# Patient Record
Sex: Female | Born: 1980 | Race: White | Hispanic: No | Marital: Single | Smoking: Never smoker
Health system: Southern US, Community
[De-identification: ages and names within clinical notes are randomized; demographics above are authoritative.]

## PROBLEM LIST (undated history)

## (undated) DIAGNOSIS — I10 Essential (primary) hypertension: Secondary | ICD-10-CM

## (undated) DIAGNOSIS — Q059 Spina bifida, unspecified: Secondary | ICD-10-CM

## (undated) DIAGNOSIS — N920 Excessive and frequent menstruation with regular cycle: Secondary | ICD-10-CM

## (undated) DIAGNOSIS — Z1371 Encounter for nonprocreative screening for genetic disease carrier status: Secondary | ICD-10-CM

## (undated) DIAGNOSIS — C4492 Squamous cell carcinoma of skin, unspecified: Secondary | ICD-10-CM

## (undated) DIAGNOSIS — E282 Polycystic ovarian syndrome: Secondary | ICD-10-CM

## (undated) DIAGNOSIS — Z8041 Family history of malignant neoplasm of ovary: Secondary | ICD-10-CM

## (undated) HISTORY — DX: Essential (primary) hypertension: I10

## (undated) HISTORY — DX: Excessive and frequent menstruation with regular cycle: N92.0

## (undated) HISTORY — PX: TUBAL LIGATION: SHX77

## (undated) HISTORY — DX: Encounter for nonprocreative screening for genetic disease carrier status: Z13.71

## (undated) HISTORY — DX: Spina bifida, unspecified: Q05.9

## (undated) HISTORY — DX: Family history of malignant neoplasm of ovary: Z80.41

## (undated) HISTORY — DX: Squamous cell carcinoma of skin, unspecified: C44.92

## (undated) HISTORY — DX: Polycystic ovarian syndrome: E28.2

---
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
## 2008-01-23 ENCOUNTER — Encounter: Payer: Self-pay | Admitting: Maternal & Fetal Medicine

## 2008-02-06 ENCOUNTER — Observation Stay: Payer: Self-pay

## 2008-02-07 ENCOUNTER — Inpatient Hospital Stay: Payer: Self-pay

## 2008-07-05 DIAGNOSIS — D239 Other benign neoplasm of skin, unspecified: Secondary | ICD-10-CM

## 2008-07-05 HISTORY — DX: Other benign neoplasm of skin, unspecified: D23.9

## 2011-09-27 ENCOUNTER — Observation Stay: Payer: Self-pay | Admitting: Obstetrics and Gynecology

## 2011-10-12 ENCOUNTER — Ambulatory Visit: Payer: Self-pay

## 2011-10-13 ENCOUNTER — Inpatient Hospital Stay: Payer: Self-pay

## 2012-12-01 DIAGNOSIS — L680 Hirsutism: Secondary | ICD-10-CM | POA: Insufficient documentation

## 2013-01-19 ENCOUNTER — Ambulatory Visit: Payer: Self-pay

## 2013-01-19 LAB — PREGNANCY, URINE: Pregnancy Test, Urine: NEGATIVE m[IU]/mL

## 2013-01-20 ENCOUNTER — Ambulatory Visit: Payer: Self-pay

## 2013-01-20 HISTORY — PX: HYSTEROSCOPY WITH D & C: SHX1775

## 2015-03-08 NOTE — Op Note (Signed)
PATIENT NAME:  Claire Olsen, DELAVEGA MR#:  [id number] DATE OF BIRTH:  03/10/1981  DATE OF PROCEDURE:  01/20/2013  PREOPERATIVE DIAGNOSIS:  Severe menorrhagia.   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   OPERATION PERFORMED:  D and C, attempted hysteroscopy, Mirena IUD insertion.   OPERATIVE FINDINGS:  Uterus sounded to 10 cm. I was unable to visualize uterine cavity secondary to much bleeding.   DESCRIPTION OF PROCEDURE:  After adequate general anesthesia, the patient was prepped and draped in routine fashion. 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 Cervix was grasped with Ardis Hughs tenaculum. The uterine cavity sounded to 10 cm. The cervix was dilated with ease.  Hysteroscopy instrument was inserted and attempted visualization was unsuccessful secondary to much blood. Uterine cavity was then systematically curetted with return of a large amount of tissue. Exploration using polyp forceps revealed no further tissue. The Mirena IUD was inserted without incident. The patient tolerated the procedure well and left the operating room in good condition. Sponge and needle counts were said to be correct at the end of the procedure.     ____________________________ Wonda Cheng. Laurey Morale, MD pjr:dmm D: 01/20/2013 12:57:02 ET T: 01/20/2013 13:35:16 ET JOB#: [id number]  cc: Wonda Cheng. Laurey Morale, MD, <Dictator> Rosina Lowenstein MD ELECTRONICALLY SIGNED 01/23/2013 20:48

## 2016-03-09 ENCOUNTER — Other Ambulatory Visit: Payer: Self-pay | Admitting: Advanced Practice Midwife

## 2016-03-09 DIAGNOSIS — N644 Mastodynia: Secondary | ICD-10-CM

## 2016-03-19 ENCOUNTER — Ambulatory Visit
Admission: RE | Admit: 2016-03-19 | Discharge: 2016-03-19 | Disposition: A | Discharge status: Home / Self Care | Payer: BC Managed Care – PPO | Source: Ambulatory Visit | Attending: Advanced Practice Midwife | Admitting: Advanced Practice Midwife

## 2016-03-19 DIAGNOSIS — N644 Mastodynia: Secondary | ICD-10-CM

## 2016-04-16 DIAGNOSIS — Z1371 Encounter for nonprocreative screening for genetic disease carrier status: Secondary | ICD-10-CM

## 2016-04-16 HISTORY — DX: Encounter for nonprocreative screening for genetic disease carrier status: Z13.71

## 2016-11-03 ENCOUNTER — Other Ambulatory Visit: Payer: Self-pay | Admitting: Advanced Practice Midwife

## 2016-11-03 DIAGNOSIS — N644 Mastodynia: Secondary | ICD-10-CM

## 2016-11-26 ENCOUNTER — Ambulatory Visit
Admission: RE | Admit: 2016-11-26 | Discharge: 2016-11-26 | Disposition: A | Discharge status: Home / Self Care | Payer: BC Managed Care – PPO | Source: Ambulatory Visit | Attending: Advanced Practice Midwife | Admitting: Advanced Practice Midwife

## 2016-11-26 DIAGNOSIS — N644 Mastodynia: Secondary | ICD-10-CM | POA: Diagnosis not present

## 2017-03-10 DIAGNOSIS — E282 Polycystic ovarian syndrome: Secondary | ICD-10-CM | POA: Insufficient documentation

## 2017-03-25 ENCOUNTER — Encounter: Payer: Self-pay | Admitting: Obstetrics and Gynecology

## 2017-03-30 ENCOUNTER — Ambulatory Visit: Payer: Self-pay | Admitting: Obstetrics and Gynecology

## 2017-05-05 ENCOUNTER — Ambulatory Visit: Payer: Self-pay | Admitting: Obstetrics and Gynecology

## 2017-07-28 ENCOUNTER — Ambulatory Visit (INDEPENDENT_AMBULATORY_CARE_PROVIDER_SITE_OTHER): Payer: BC Managed Care – PPO | Admitting: Obstetrics and Gynecology

## 2017-07-28 ENCOUNTER — Encounter: Payer: Self-pay | Admitting: Obstetrics and Gynecology

## 2017-07-28 VITALS — BP 120/80 | HR 84 | Ht 70.0 in | Wt 227.0 lb

## 2017-07-28 DIAGNOSIS — Z8041 Family history of malignant neoplasm of ovary: Secondary | ICD-10-CM

## 2017-07-28 DIAGNOSIS — Z113 Encounter for screening for infections with a predominantly sexual mode of transmission: Secondary | ICD-10-CM

## 2017-07-28 DIAGNOSIS — Z1231 Encounter for screening mammogram for malignant neoplasm of breast: Secondary | ICD-10-CM | POA: Diagnosis not present

## 2017-07-28 DIAGNOSIS — N920 Excessive and frequent menstruation with regular cycle: Secondary | ICD-10-CM

## 2017-07-28 DIAGNOSIS — Z30431 Encounter for routine checking of intrauterine contraceptive device: Secondary | ICD-10-CM | POA: Diagnosis not present

## 2017-07-28 DIAGNOSIS — Z124 Encounter for screening for malignant neoplasm of cervix: Secondary | ICD-10-CM

## 2017-07-28 DIAGNOSIS — Z01419 Encounter for gynecological examination (general) (routine) without abnormal findings: Secondary | ICD-10-CM

## 2017-07-28 DIAGNOSIS — R928 Other abnormal and inconclusive findings on diagnostic imaging of breast: Secondary | ICD-10-CM

## 2017-07-28 DIAGNOSIS — Z1239 Encounter for other screening for malignant neoplasm of breast: Secondary | ICD-10-CM

## 2017-07-28 NOTE — Progress Notes (Signed)
PCP:  Patient, No Pcp Per   Chief Complaint  Patient presents with  . Gynecologic Exam    Cycle has returned in past 6 months & is extended & heavy. IUD expire 01/2018     HPI:      [name] is a 36 y.o. G2P0010 who LMP was Patient's last menstrual period was 07/06/2017., presents today for her annual examination.  Her menses are regular every 28-30 days, lasting 9-12 days. Flow is heavy, changing super plus Q2 hrs, with clots.  Dysmenorrhea mild, occurring first 1-2 days of flow. She does not have intermenstrual bleeding. She had Mirena placed 01/25/13 due to menorrhagia after D&C. She had amenorrhea until the past 5-6 months and periods are getting heavier and longer each month. She and Dr. Laurey Morale discussed endometrial ablation if IUD didn't work. Pt would like another IUD.  Sex activity: single partner, contraception - tubal ligation.  Last Pap: June 17, 2015  Results were: no abnormalities /neg HPV DNA  Hx of STDs: none  Last mammogram: 1/18 at [facility].  Results were: cat 3 for bilat breast asymmetries. Pt due for 6 mo f/u mammo and u/s.  There is no FH of breast cancer. There is a FH of ovarian cancer in her mother, mat aunt and PGM. Pt is MyRisk neg 2017. The patient does do self-breast exams.  Tobacco use: The patient denies current or previous tobacco use. Alcohol use: none No drug use.  Exercise: moderately active  She does get adequate calcium and Vitamin D in her diet.    Past Medical History:  Diagnosis Date  . BRCA negative 04/2016   MyRisk neg  . Family history of ovarian cancer    mother and mat aunt  . Menorrhagia   . PCOS (polycystic ovarian syndrome)   . 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 CESAREAN SECTION  2009/2012  . HYSTEROSCOPY W/D&C  01/20/2013   Severe menorrhagia   ([facility])  . TUBAL LIGATION      Family History  Problem Relation Age of Onset  . Cervical cancer Mother    . Ovarian cancer Mother 33  . Hyperlipidemia Mother   . Ovarian cancer Maternal Aunt        late 20's  . Hypertension Father   . Hyperlipidemia Maternal Grandfather   . Hypertension Paternal Grandmother   . Melanoma Paternal Grandfather 12  . Prostate cancer Paternal Grandfather   . Breast cancer Neg Hx     Social History   Social History  . Marital status: Single    Spouse name: N/A  . Number of children: N/A  . Years of education: N/A   Occupational History  . Not on file.   Social History Main Topics  . Smoking status: Never Smoker  . Smokeless tobacco: Never Used  . Alcohol use No  . Drug use: No  . Sexual activity: Yes    Partners: Male    Birth control/ protection: IUD   Other Topics Concern  . Not on file   Social History Narrative  . No narrative on file    Current Meds  Medication Sig  . levonorgestrel (MIRENA) 20 MCG/24HR IUD 1 each by Intrauterine route once.     ROS:  Review of Systems  Constitutional: Negative for fatigue, fever and unexpected weight change.  Respiratory: Negative for cough, shortness of breath and wheezing.   Cardiovascular: Negative for chest pain, palpitations  and leg swelling.  Gastrointestinal: Negative for blood in stool, constipation, diarrhea, nausea and vomiting.  Endocrine: Negative for cold intolerance, heat intolerance and polyuria.  Genitourinary: Negative for dyspareunia, dysuria, flank pain, frequency, genital sores, hematuria, menstrual problem, pelvic pain, urgency, vaginal bleeding, vaginal discharge and vaginal pain.  Musculoskeletal: Negative for back pain, joint swelling and myalgias.  Skin: Negative for rash.  Neurological: Negative for dizziness, syncope, light-headedness, numbness and headaches.  Hematological: Negative for adenopathy.  Psychiatric/Behavioral: Negative for agitation, confusion, sleep disturbance and suicidal ideas. The patient is not nervous/anxious.      Objective: BP 120/80 (BP  Location: Left Arm, Patient Position: Sitting, Cuff Size: Normal)   Pulse 84   Ht 5' 10"  (1.778 m)   Wt 227 lb (103 kg)   LMP 07/06/2017   BMI 32.57 kg/m    Physical Exam  Constitutional: She is oriented to person, place, and time. She appears well-developed and well-nourished.  Genitourinary: Vagina normal and uterus normal. There is no rash or tenderness on the right labia. There is no rash or tenderness on the left labia. No erythema or tenderness in the vagina. No vaginal discharge found. Right adnexum does not display mass and does not display tenderness. Left adnexum does not display mass and does not display tenderness. Cervix does not exhibit motion tenderness or polyp. Uterus is not enlarged or tender.  Neck: Normal range of motion. No thyromegaly present.  Cardiovascular: Normal rate, regular rhythm and normal heart sounds.   No murmur heard. Pulmonary/Chest: Effort normal and breath sounds normal. Right breast exhibits no mass, no nipple discharge, no skin change and no tenderness. Left breast exhibits no mass, no nipple discharge, no skin change and no tenderness.  Abdominal: Soft. There is no tenderness. There is no guarding.  Musculoskeletal: Normal range of motion.  Neurological: She is alert and oriented to person, place, and time. No cranial nerve deficit.  Psychiatric: She has a normal mood and affect. Her behavior is normal.  Vitals reviewed.   Assessment/Plan: Encounter for annual routine gynecological examination  Cervical cancer screening - Plan: IGP, Aptima HPV  Screening for STD (sexually transmitted disease) - Plan: IGP, Aptima HPV  Abnormal mammogram - Pt to sched mammo/u/s f/u.  - Plan: MM DIAG BREAST TOMO BILATERAL, US BREAST LTD UNI LEFT INC AXILLA, US BREAST LTD UNI RIGHT INC AXILLA  Screening for breast cancer  Family history of ovarian cancer - Pt is MyRisk neg. No further screening at this time.   Menorrhagia with regular cycle - Worsening sx over  the past 6 months. Pt will RTO with next menses to remove old IUD and insert new Mirena. Discussed hormonal drop at year 4 of Mirena.  Encounter for routine checking of intrauterine contraceptive device (IUD) - Placed 01/25/13           GYN counsel breast self exam, mammography screening, adequate intake of calcium and vitamin D, diet and exercise     F/U  Return in about 1 year (around 07/28/2018).  Makenzey Nanni B. Jenasia Dolinar, PA-C 07/28/2017 2:55 PM

## 2017-07-30 LAB — IGP, APTIMA HPV
HPV APTIMA: NEGATIVE
PAP SMEAR COMMENT: 0

## 2017-08-17 ENCOUNTER — Ambulatory Visit
Admission: RE | Admit: 2017-08-17 | Discharge: 2017-08-17 | Disposition: A | Discharge status: Home / Self Care | Payer: BC Managed Care – PPO | Source: Ambulatory Visit | Attending: Obstetrics and Gynecology | Admitting: Obstetrics and Gynecology

## 2017-08-17 DIAGNOSIS — R928 Other abnormal and inconclusive findings on diagnostic imaging of breast: Secondary | ICD-10-CM

## 2017-09-14 ENCOUNTER — Encounter: Payer: Self-pay | Admitting: Advanced Practice Midwife

## 2017-09-14 ENCOUNTER — Ambulatory Visit (INDEPENDENT_AMBULATORY_CARE_PROVIDER_SITE_OTHER): Payer: BC Managed Care – PPO | Admitting: Advanced Practice Midwife

## 2017-09-14 VITALS — BP 130/80 | HR 74 | Ht 70.0 in | Wt 233.0 lb

## 2017-09-14 DIAGNOSIS — E669 Obesity, unspecified: Secondary | ICD-10-CM | POA: Insufficient documentation

## 2017-09-14 DIAGNOSIS — Z30433 Encounter for removal and reinsertion of intrauterine contraceptive device: Secondary | ICD-10-CM

## 2017-09-14 NOTE — Progress Notes (Signed)
    GYNECOLOGY OFFICE PROCEDURE NOTE  Claire Olsen is a 36 y.o. G2P0010 here for IUD removal and reinsertion. The patient currently has a Mirena IUD placed on March 2013, which will be replaced with a Mirena IUD today.  No GYN concerns.  Last pap smear was September 2018 and was normal. She has had a bilateral tubal ligation and has been using the IUD primarily for heavy cycle control. Her bleeding has increased some since the IUD is 6 months past expiration.   IUD Removal and Reinsertion  Patient identified, informed consent performed, consent signed.   Discussed risks of irregular bleeding, cramping, infection, malpositioning or uterine perforation of the IUD which may require further procedures. Time out was performed. Speculum placed in the vagina. The strings of the IUD were grasped and pulled using ring forceps. The IUD was successfully removed in its entirety. The cervix was cleaned with Betadine x 2. The uterus was sounded to IUD insertion apparatus was used to sound the uterus to 8 cm using a uterine sound.  The IUD was then placed per manufacturer's recommendations. Strings trimmed to 3 cm. Tenaculum was removed, good hemostasis noted. Patient tolerated procedure well.   Patient was given post-procedure instructions.  Patient was also asked to check IUD strings periodically and follow up as needed for IUD check.   Rod Can, CNM  [facility], [facility]  IUD insertion CPT [id number],  Isla Pence [id number] Mirena [phone] [place]ie [place] [phone] Paraguard [id number] IUD remval 26712 [id number] 33, plus Modifer 79 is done during a global billing visit

## 2017-09-15 ENCOUNTER — Ambulatory Visit: Payer: BC Managed Care – PPO | Admitting: Maternal Newborn

## 2018-03-08 IMAGING — US US BREAST*R* LIMITED INC AXILLA
1 series · 4 of 4 positions shown · non-contrast
Comparison: None.

CLINICAL DATA: 35-year-old female with intermittent left breast
pain at 3 o'clock for 6 months. This is the patient's baseline exam.
The patient reports a family history of ovarian and cervical cancer
in her mother diagnosed at the age of 34 and ovarian cancer
diagnosed in a maternal aunt in her 30s. The patient states that
genetic testing has been recommended to her.

EXAM:
2D DIGITAL DIAGNOSTIC BILATERAL MAMMOGRAM WITH CAD AND ADJUNCT TOMO
ULTRASOUND BILATERAL BREAST

[Series 1: us breast*right* limited inc axilla · 0.06mm/px · 4 of 4 slices shown]
[im 1/4]
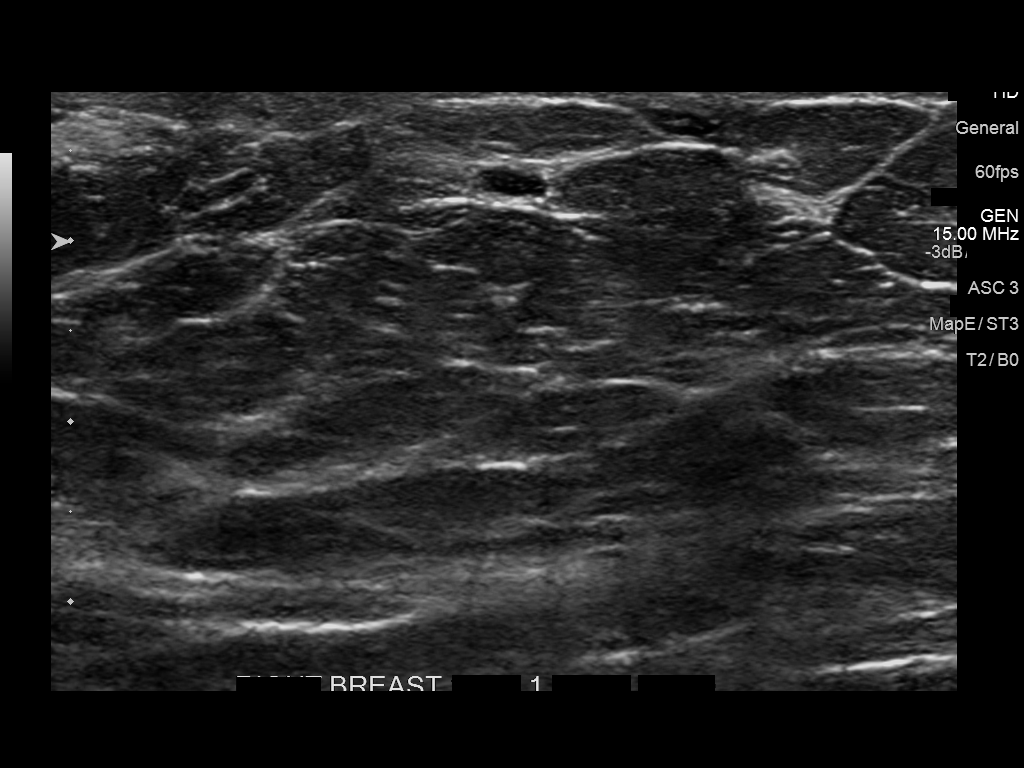
[im 2/4]
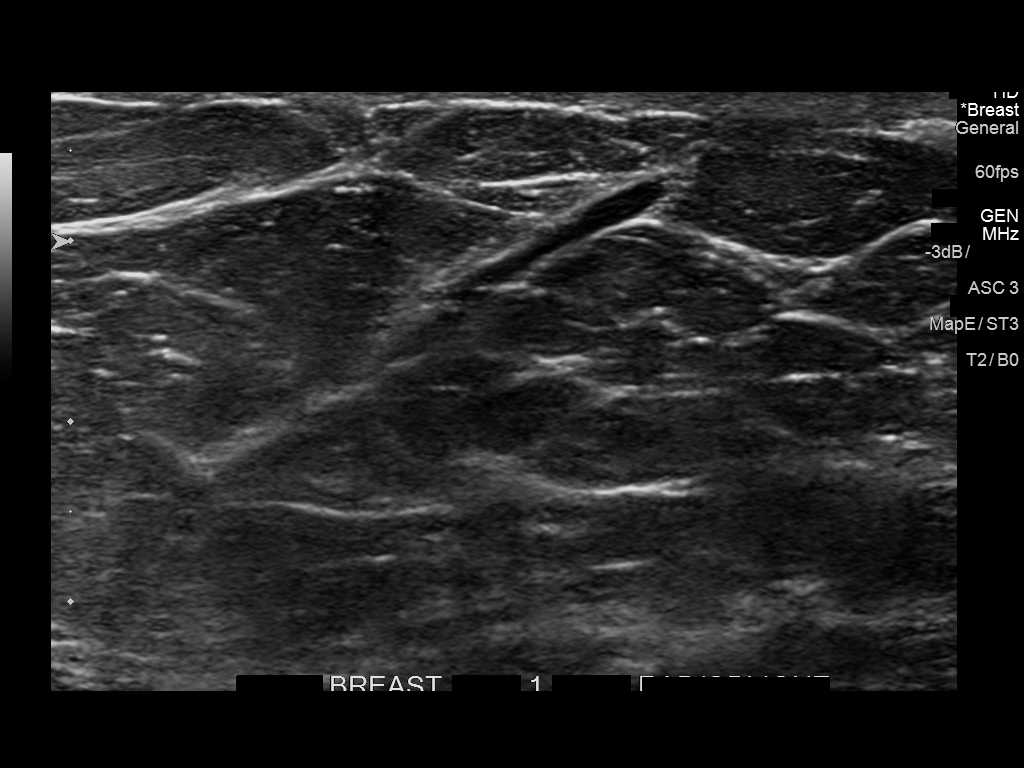
[im 3/4]
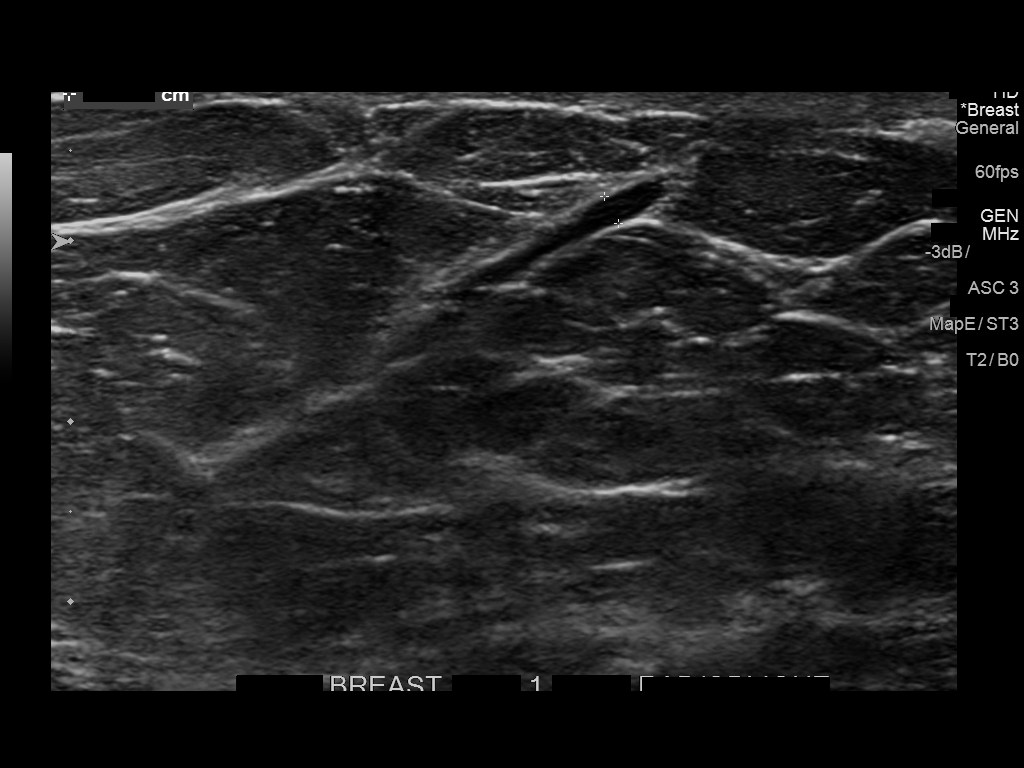
[im 4/4]
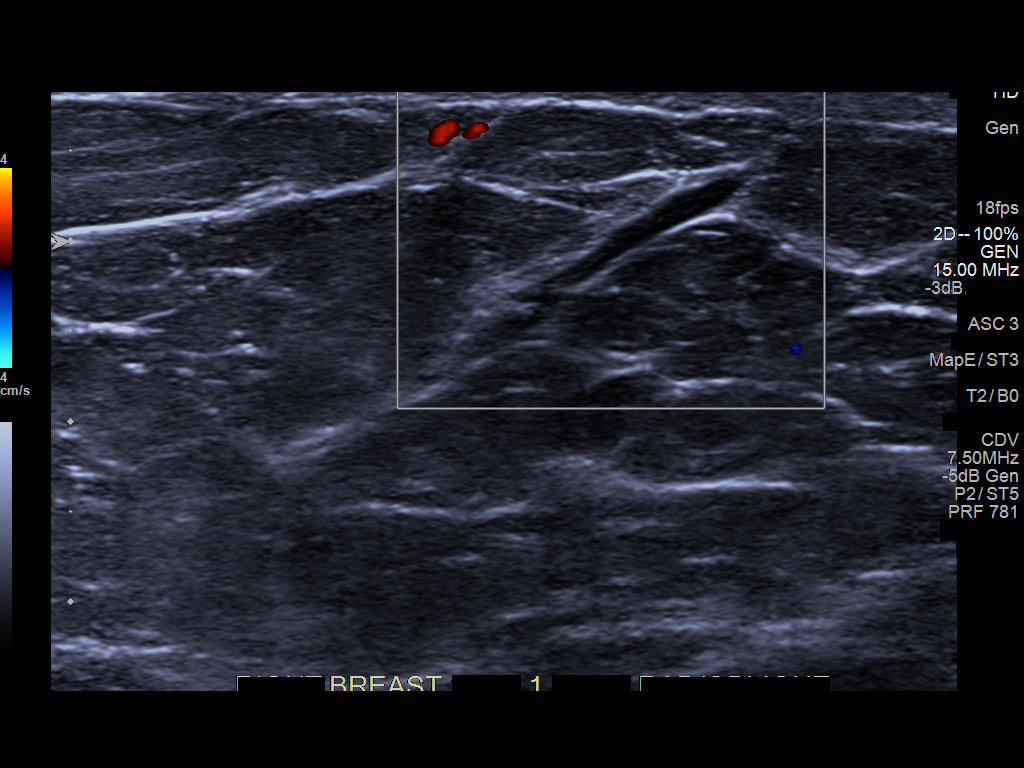

[4 of 4 positions shown; findings below may reference images not displayed]

ACR Breast Density Category b: There are scattered areas of
fibroglandular density.
FINDINGS: An asymmetry was identified within the lateral left breast which
appeared less conspicuous on spot compression view of this region
and may represent an island of fibroglandular tissue. Within the
anterior right breast, an asymmetry was identified just superior to
the nipple. No suspicious calcifications or distortion was
identified within either breast.

Mammographic images were processed with CAD.

On physical exam, no discrete mass is felt in the area of concern
within the lateral left breast or in the superior right breast.

Targeted ultrasound of the lateral left breast was performed
demonstrating no suspicious cystic or solid sonographic finding. No
definite correlate was identified for the asymmetry seen
mammographically. Targeted ultrasound of the right breast
demonstrates no suspicious cystic or solid sonographic finding in
the area of concern. Minimal duct ectasia is incidentally noted.
IMPRESSION: Probably benign bilateral asymmetries.

RECOMMENDATION:
1. Bilateral diagnostic mammogram and possible ultrasound in 6
months.
2. The patient was instructed to follow-up with her referring
clinician regarding her breast pain and recommendation for genetic
testing.

I have discussed the findings and recommendations with the patient.
Results were also provided in writing at the conclusion of the
visit. If applicable, a reminder letter will be sent to the patient
regarding the next appointment.

BI-RADS CATEGORY  3: Probably benign.

## 2019-08-06 IMAGING — US US BREAST*L* LIMITED INC AXILLA
1 series · 6 of 6 positions shown · non-contrast
Comparison: 11/26/2016, 03/19/2016.

CLINICAL DATA: One year interval follow-up of likely benign focal
asymmetries in both breast, without sonographic correlate on
previous ultrasounds. The patient presented in March 2016 with a
palpable lump associated with intermittent pain in the outer left
breast.

EXAM:
2D DIGITAL DIAGNOSTIC BILATERAL MAMMOGRAM WITH CAD AND ADJUNCT TOMO
ULTRASOUND LEFT BREAST

[Series 1: us breast*left* limited inc axilla · 0.07mm/px · 6 of 6 slices shown]
[im 1/6]
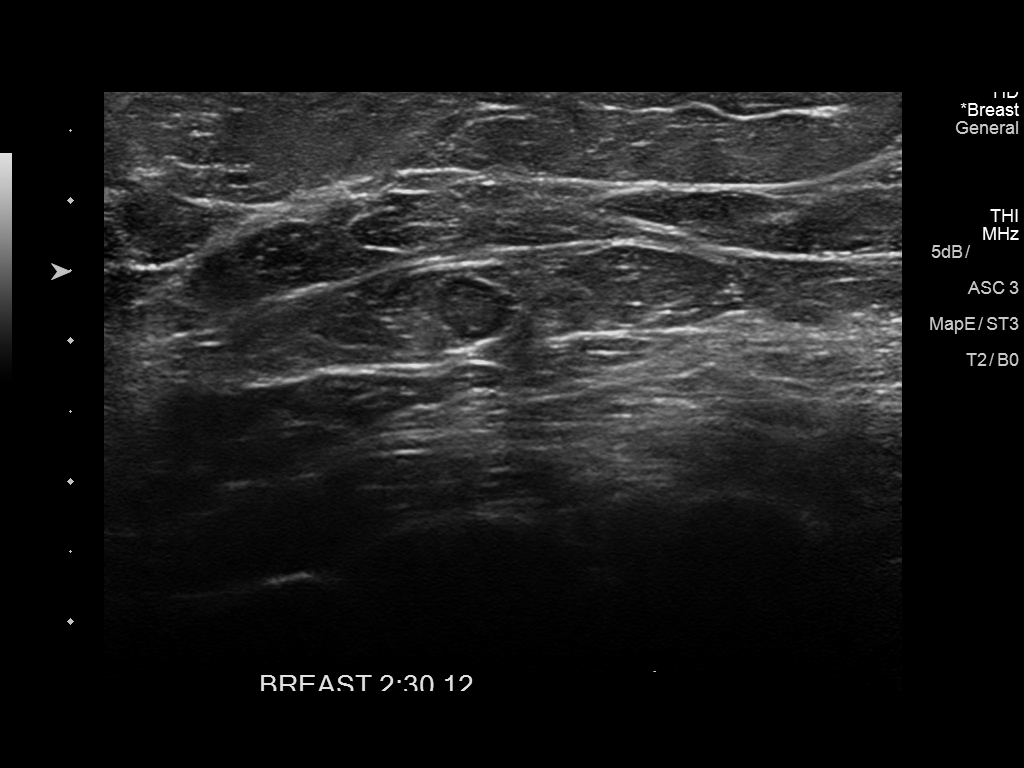
[im 2/6]
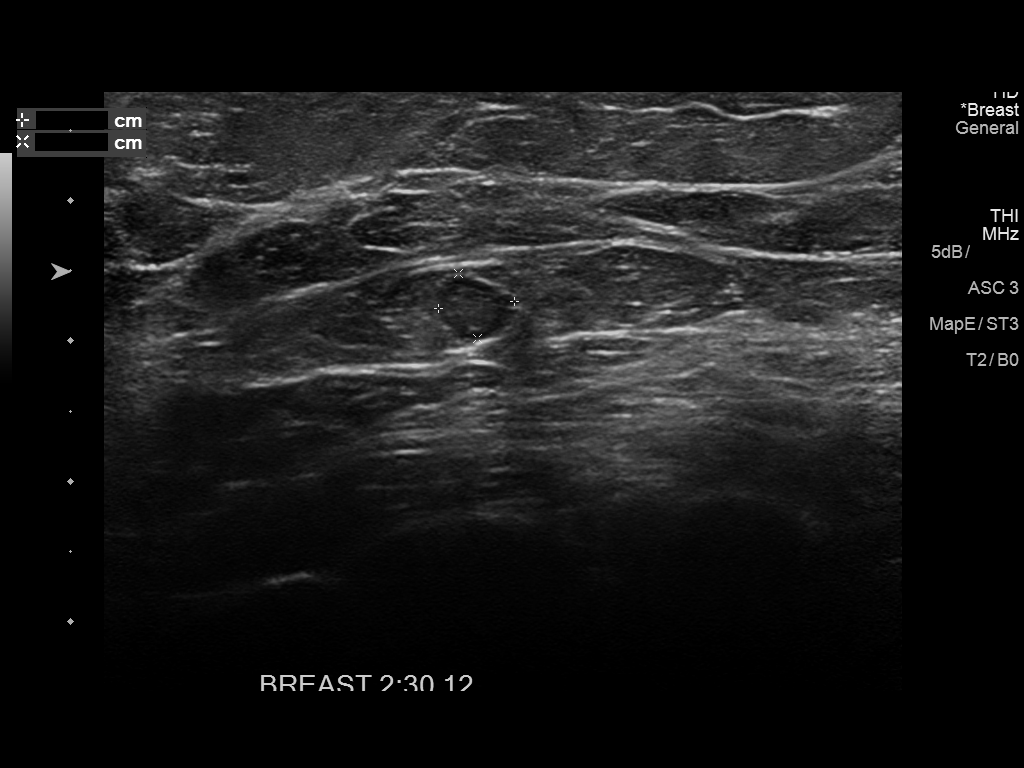
[im 3/6]
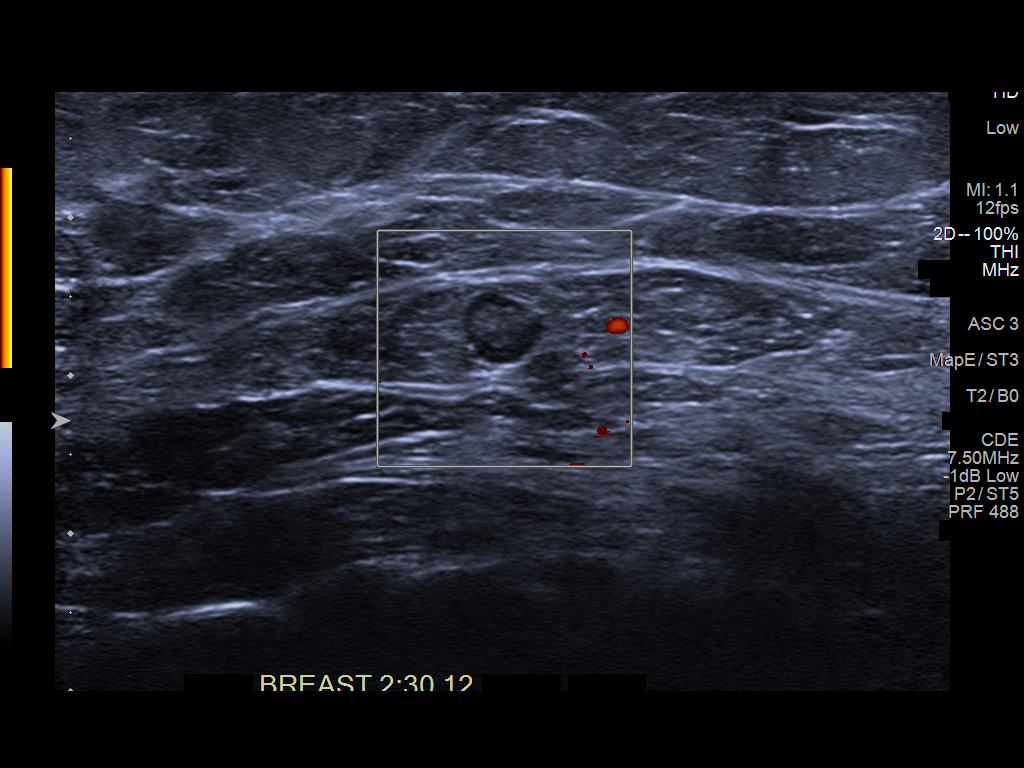
[im 4/6]
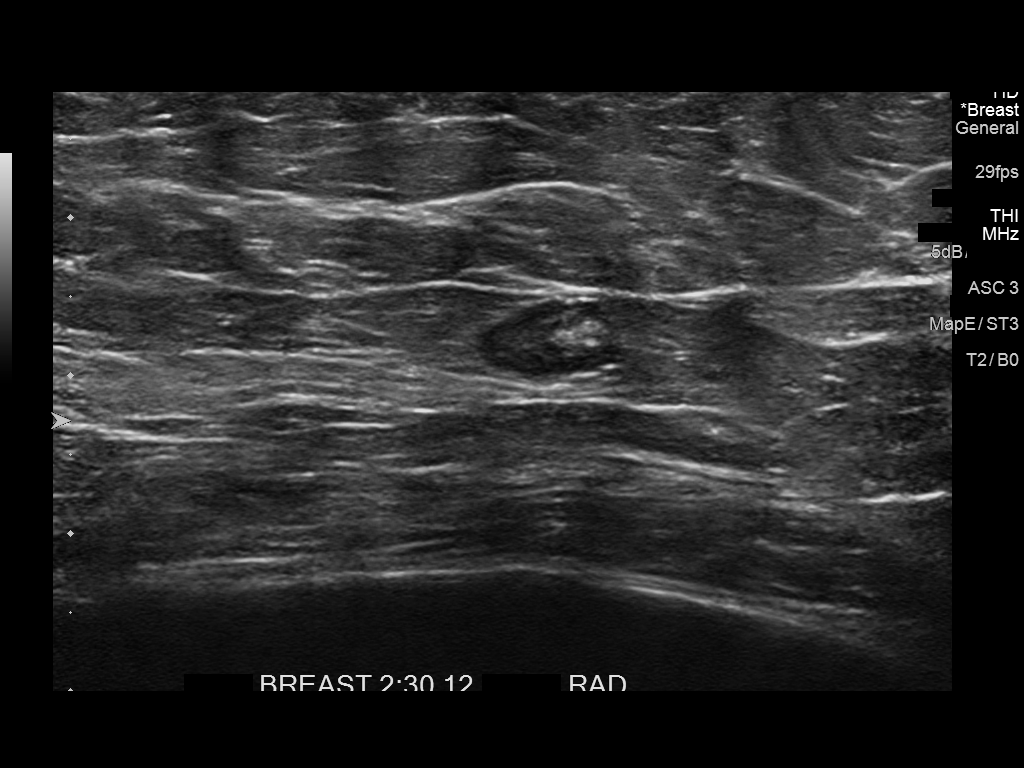
[im 5/6]
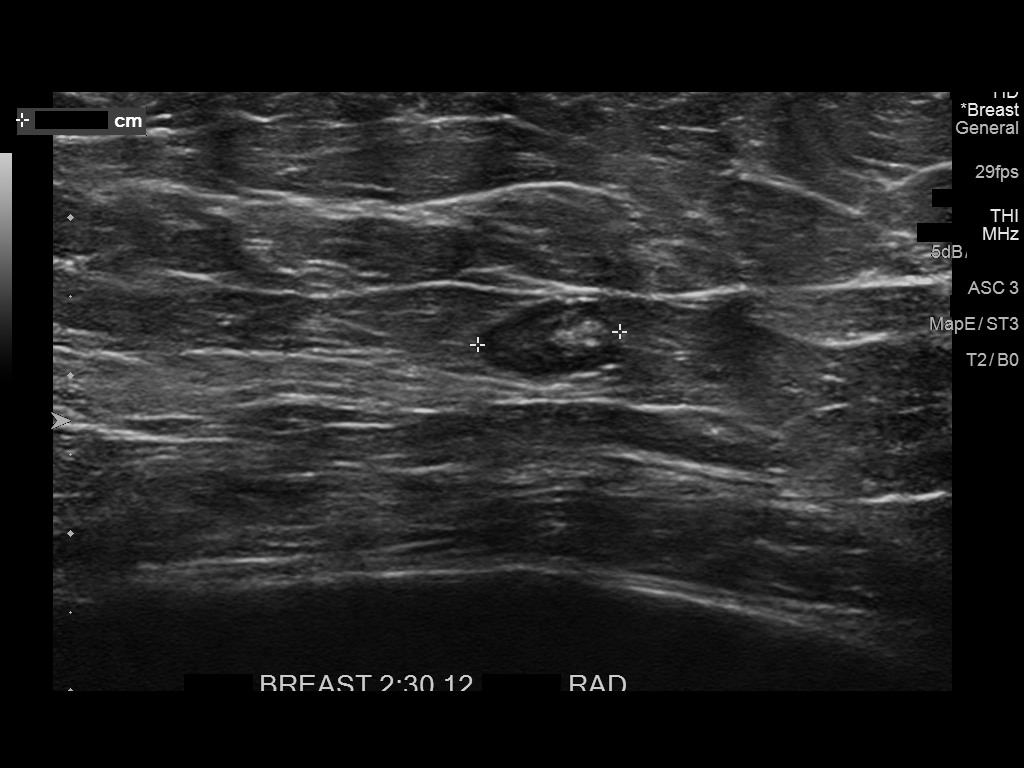
[im 6/6]
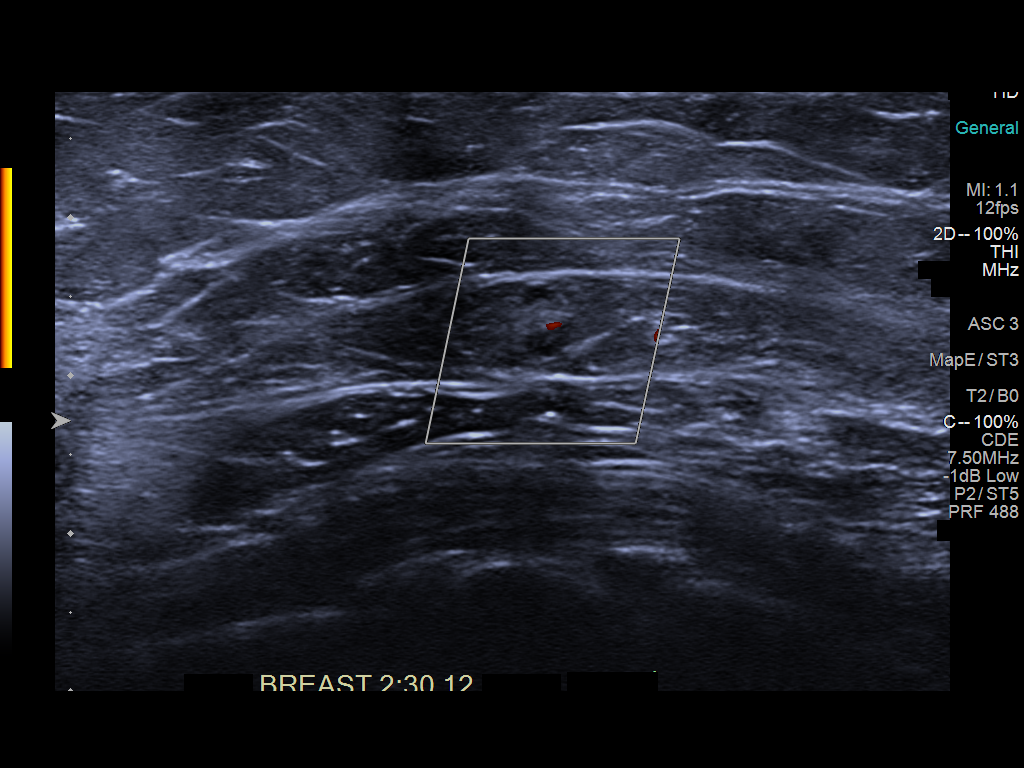

[6 of 6 positions shown; findings below may reference images not displayed]

ACR Breast Density Category b: There are scattered areas of
fibroglandular density.
FINDINGS: Standard 2D and tomosynthesis full field CC and MLO views of both
breasts were obtained.

The area of concern on the original baseline mammogram median
subareolar right breast corresponds to normal fibroglandular tissue
on the tomosynthesis images. No new or suspicious findings in the
right breast.

Interval development of an approximate 1.5 cm fat containing mass in
the outer left breast at posterior depth, at or near the original
area of palpable concern and intermittent pain. No suspicious
findings in the left breast.

Mammographic images were processed with CAD.

On physical exam, there is no discrete palpable abnormality in the
outer left breast.

Targeted left breast ultrasound is performed, showing a
circumscribed heterogeneous mass at the 2:30 o'clock position
approximately 12 cm from the nipple measuring approximately 5 x 5 x
9 mm, demonstrating posterior acoustic enhancement and demonstrating
internal power Doppler flow. No suspicious solid mass or abnormal
acoustic shadowing is identified.
IMPRESSION: 1. Likely benign oil cyst versus intramammary lymph node in the
outer left breast at posterior depth.
2. No mammographic evidence of malignancy involving the right
breast.

RECOMMENDATION:
Bilateral diagnostic mammography in 1 year in order to confirm 2
years of stability of the likely benign findings.

I have discussed the findings and recommendations with the patient.
Results were also provided in writing at the conclusion of the
visit. If applicable, a reminder letter will be sent to the patient
regarding the next appointment.

BI-RADS CATEGORY  3: Probably benign.

## 2020-08-21 ENCOUNTER — Ambulatory Visit: Payer: BC Managed Care – PPO | Admitting: Obstetrics and Gynecology

## 2020-08-29 ENCOUNTER — Ambulatory Visit: Payer: BC Managed Care – PPO | Admitting: Obstetrics and Gynecology

## 2020-09-18 ENCOUNTER — Encounter: Payer: Self-pay | Admitting: Obstetrics and Gynecology

## 2020-09-18 ENCOUNTER — Other Ambulatory Visit: Payer: Self-pay

## 2020-09-18 ENCOUNTER — Ambulatory Visit (INDEPENDENT_AMBULATORY_CARE_PROVIDER_SITE_OTHER): Payer: BC Managed Care – PPO | Admitting: Obstetrics and Gynecology

## 2020-09-18 VITALS — BP 117/65 | HR 87 | Resp 18 | Ht 70.0 in | Wt 280.0 lb

## 2020-09-18 DIAGNOSIS — L680 Hirsutism: Secondary | ICD-10-CM | POA: Diagnosis not present

## 2020-09-18 DIAGNOSIS — Z1339 Encounter for screening examination for other mental health and behavioral disorders: Secondary | ICD-10-CM

## 2020-09-18 DIAGNOSIS — Z01419 Encounter for gynecological examination (general) (routine) without abnormal findings: Secondary | ICD-10-CM

## 2020-09-18 DIAGNOSIS — Z1331 Encounter for screening for depression: Secondary | ICD-10-CM | POA: Diagnosis not present

## 2020-09-18 MED ORDER — SPIRONOLACTONE 25 MG PO TABS: 25.0000 mg | ORAL_TABLET | Freq: Every day | ORAL | 1 refills | Status: DC

## 2020-09-18 NOTE — Progress Notes (Signed)
Gynecology Annual Exam   PCP: Patient, No Pcp Per  Chief Complaint  Patient presents with  . Gynecologic Exam   History of Present Illness:  [name] is a 39 y.o. [phone] who LMP was No LMP recorded. (Menstrual status: IUD)., presents today for her annual examination.  Her menses are absent with her IUD.  She is sexually active.  She has an IUD and has had a BTL.   Last Pap: 07/2017  Results were: no abnormalities /neg HPV DNA negative Hx of STDs: none  There is no FH of breast cancer. There is a FH of ovarian cancer in her mother and maternal aunt.  She has had a negative MyRisk test. The patient does do self-breast exams.  Tobacco use: The patient denies current or previous tobacco use. Alcohol use: social drinker Exercise: walks on treadmill at home.   The patient wears seatbelts: yes.   The patient reports that domestic violence in her life is absent.   She has questions about hair growth and how she can reduce it and alleviate the hair she has.   Past Medical History:  Diagnosis Date  . BRCA negative 04/2016   MyRisk neg  . Family history of ovarian cancer    mother and mat aunt  . Hypertension   . Menorrhagia   . PCOS (polycystic ovarian syndrome)   . 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 CESAREAN SECTION  2009/2012  . HYSTEROSCOPY WITH D & C  01/20/2013   Severe menorrhagia   ([facility])  . TUBAL LIGATION      Prior to Admission medications   Medication Sig Start Date End Date Taking? Authorizing Provider  levonorgestrel (MIRENA) 20 MCG/24HR IUD 1 each by Intrauterine route once.   Yes [provider]  Multiple Vitamin (MULTI-VITAMINS) TABS Take by mouth. 10/17/10  Yes [provider]  olmesartan-hydrochlorothiazide (BENICAR HCT) 40-12.5 MG tablet Take 1 tablet by mouth daily. 06/10/20  Yes [provider]  rosuvastatin (CRESTOR) 5 MG tablet Take 5 mg by mouth  daily. 09/13/20  Yes [provider]    Allergies  Allergen Reactions  . Sulfa Antibiotics Rash    Obstetric History: [id number]   Social History   Socioeconomic History  . Marital status: Single    Spouse name: Not on file  . Number of children: Not on file  . Years of education: Not on file  . Highest education level: Not on file  Occupational History  . Not on file  Tobacco Use  . Smoking status: Never Smoker  . Smokeless tobacco: Never Used  Vaping Use  . Vaping Use: Never used  Substance and Sexual Activity  . Alcohol use: Yes    Comment: infrequent  . Drug use: No  . Sexual activity: Yes    Partners: Male    Birth control/protection: I.U.D., Surgical  Other Topics Concern  . Not on file  Social History Narrative  . Not on file   Social Determinants of Health   Financial Resource Strain:   . Difficulty of Paying Living Expenses: Not on file  Food Insecurity:   . Worried About Charity fundraiser in the Last Year: Not on file  . Ran Out of Food in the Last Year: Not on file  Transportation Needs:   . Lack of Transportation (Medical): Not on file  . Lack of Transportation (Non-Medical): Not on file  Physical Activity:   .  Days of Exercise per Week: Not on file  . Minutes of Exercise per Session: Not on file  Stress:   . Feeling of Stress : Not on file  Social Connections:   . Frequency of Communication with Friends and Family: Not on file  . Frequency of Social Gatherings with Friends and Family: Not on file  . Attends Religious Services: Not on file  . Active Member of Clubs or Organizations: Not on file  . Attends Archivist Meetings: Not on file  . Marital Status: Not on file  Intimate Partner Violence:   . Fear of Current or Ex-Partner: Not on file  . Emotionally Abused: Not on file  . Physically Abused: Not on file  . Sexually Abused: Not on file    Family History  Problem Relation Age of Onset  . Cervical cancer Mother   .  Ovarian cancer Mother 43  . Hyperlipidemia Mother   . Stroke Mother   . Ovarian cancer Maternal Aunt        late 20's  . Hypertension Father   . Hyperlipidemia Maternal Grandfather   . Hypertension Paternal Grandmother   . Melanoma Paternal Grandfather 48  . Prostate cancer Paternal Grandfather   . Breast cancer Neg Hx     Review of Systems  Constitutional: Negative.   HENT: Negative.   Eyes: Negative.   Respiratory: Negative.   Cardiovascular: Negative.   Gastrointestinal: Negative.   Genitourinary: Negative.   Musculoskeletal: Negative.   Skin: Negative.   Neurological: Negative.   Psychiatric/Behavioral: Negative.      Physical Exam BP 117/65   Pulse 87   Resp 18   Ht 5' 10"  (1.778 m)   Wt 280 lb (127 kg)   SpO2 97%   BMI 40.18 kg/m    Physical Exam Constitutional:      General: She is not in acute distress.    Appearance: Normal appearance. She is well-developed.  Genitourinary:     Pelvic exam was performed with patient in the lithotomy position.     Vulva, urethra, bladder and uterus normal.     No inguinal adenopathy present in the right or left side.    No signs of injury in the vagina.     No vaginal discharge, erythema, tenderness or bleeding.     No cervical motion tenderness, discharge, lesion or polyp.     Uterus is mobile.     Uterus is not enlarged or tender.     No uterine mass detected.    Uterus is anteverted.     No right or left adnexal mass present.     Right adnexa not tender or full.     Left adnexa not tender or full.  HENT:     Head: Normocephalic and atraumatic.  Eyes:     General: No scleral icterus.    Conjunctiva/sclera: Conjunctivae normal.  Neck:     Thyroid: No thyromegaly.  Cardiovascular:     Rate and Rhythm: Normal rate and regular rhythm.     Heart sounds: No murmur heard.  No friction rub. No gallop.   Pulmonary:     Effort: Pulmonary effort is normal. No respiratory distress.     Breath sounds: Normal breath  sounds. No wheezing or rales.  Chest:     Breasts:        Right: No inverted nipple, mass, nipple discharge, skin change or tenderness.        Left: No inverted nipple, mass, nipple discharge,  skin change or tenderness.  Abdominal:     General: Bowel sounds are normal. There is no distension.     Palpations: Abdomen is soft. There is no mass.     Tenderness: There is no abdominal tenderness. There is no guarding or rebound.  Musculoskeletal:        General: No swelling or tenderness. Normal range of motion.     Cervical back: Normal range of motion and neck supple.  Lymphadenopathy:     Cervical: No cervical adenopathy.     Lower Body: No right inguinal adenopathy. No left inguinal adenopathy.  Neurological:     General: No focal deficit present.     Mental Status: She is alert and oriented to person, place, and time.     Cranial Nerves: No cranial nerve deficit.  Skin:    General: Skin is warm and dry.     Findings: No erythema or rash.  Psychiatric:        Mood and Affect: Mood normal.        Behavior: Behavior normal.        Judgment: Judgment normal.   Female chaperone present for pelvic and breast  portions of the physical exam  Results: AUDIT Questionnaire (screen for alcoholism): 1 PHQ-9: 3   Assessment: 39 y.o. G63P2002 female here for routine annual gynecologic examination  Plan: Problem List Items Addressed This Visit      Musculoskeletal and Integument   Hirsutism   Relevant Medications   spironolactone (ALDACTONE) 25 MG tablet   Other Relevant Orders   Potassium    Other Visit Diagnoses    Women's annual routine gynecological examination    -  Primary   Screening for depression       Screening for alcoholism          Screening: -- Blood pressure screen managed by PCP -- Weight screening: obese: discussed management options, including lifestyle, dietary, and exercise. -- Depression screening negative (PHQ-9) -- Nutrition: normal -- cholesterol  screening: per PCP -- osteoporosis screening: not due -- tobacco screening: not using -- alcohol screening: AUDIT questionnaire indicates low-risk usage. -- family history of breast cancer screening: done. not at high risk. -- no evidence of domestic violence or intimate partner violence. -- STD screening: gonorrhea/chlamydia NAAT not collected per patient request. -- pap smear not collected per ASCCP guidelines  Hair growth/hyperandrogenemia/hirsutism:  Start spironolactone at a low dose and check potassium 2-3 weeks.    Prentice Docker, MD 09/18/2020 11:06 AM

## 2020-10-03 ENCOUNTER — Other Ambulatory Visit: Payer: BC Managed Care – PPO

## 2021-01-30 ENCOUNTER — Encounter: Payer: BC Managed Care – PPO | Admitting: Dermatology

## 2021-06-11 ENCOUNTER — Other Ambulatory Visit: Payer: Self-pay

## 2021-06-11 ENCOUNTER — Ambulatory Visit: Payer: BC Managed Care – PPO | Admitting: Dermatology

## 2021-06-11 DIAGNOSIS — L720 Epidermal cyst: Secondary | ICD-10-CM

## 2021-06-11 DIAGNOSIS — L918 Other hypertrophic disorders of the skin: Secondary | ICD-10-CM

## 2021-06-11 DIAGNOSIS — L820 Inflamed seborrheic keratosis: Secondary | ICD-10-CM

## 2021-06-11 DIAGNOSIS — Z1283 Encounter for screening for malignant neoplasm of skin: Secondary | ICD-10-CM

## 2021-06-11 DIAGNOSIS — Z86018 Personal history of other benign neoplasm: Secondary | ICD-10-CM

## 2021-06-11 DIAGNOSIS — D1800 Hemangioma unspecified site: Secondary | ICD-10-CM

## 2021-06-11 DIAGNOSIS — D224 Melanocytic nevi of scalp and neck: Secondary | ICD-10-CM

## 2021-06-11 DIAGNOSIS — D2239 Melanocytic nevi of other parts of face: Secondary | ICD-10-CM | POA: Diagnosis not present

## 2021-06-11 DIAGNOSIS — L738 Other specified follicular disorders: Secondary | ICD-10-CM

## 2021-06-11 DIAGNOSIS — L578 Other skin changes due to chronic exposure to nonionizing radiation: Secondary | ICD-10-CM

## 2021-06-11 DIAGNOSIS — D2271 Melanocytic nevi of right lower limb, including hip: Secondary | ICD-10-CM

## 2021-06-11 DIAGNOSIS — D229 Melanocytic nevi, unspecified: Secondary | ICD-10-CM

## 2021-06-11 DIAGNOSIS — L814 Other melanin hyperpigmentation: Secondary | ICD-10-CM

## 2021-06-11 DIAGNOSIS — L409 Psoriasis, unspecified: Secondary | ICD-10-CM | POA: Diagnosis not present

## 2021-06-11 DIAGNOSIS — L821 Other seborrheic keratosis: Secondary | ICD-10-CM

## 2021-06-11 DIAGNOSIS — D485 Neoplasm of uncertain behavior of skin: Secondary | ICD-10-CM

## 2021-06-11 MED ORDER — KETOCONAZOLE 2 % EX SHAM: MEDICATED_SHAMPOO | CUTANEOUS | 6 refills | Status: DC

## 2021-06-11 NOTE — Patient Instructions (Addendum)
If you have any questions or concerns for your doctor, please call our main line at [phone] and press option 4 to reach your doctor's medical assistant. If no one answers, please leave a voicemail as directed and we will return your call as soon as possible. Messages left after 4 pm will be answered the following business day.   You may also send us a message via MyChart. We typically respond to MyChart messages within 1-2 business days.  For prescription refills, please ask your pharmacy to contact our office. Our fax number is [phone].  If you have an urgent issue when the clinic is closed that cannot wait until the next business day, you can page your doctor at the number below.    Please note that while we do our best to be available for urgent issues outside of office hours, we are not available 24/7.   If you have an urgent issue and are unable to reach us, you may choose to seek medical care at your doctor's office, retail clinic, urgent care center, or emergency room.  If you have a medical emergency, please immediately call 911 or go to the emergency department.  Pager Numbers  - Dr. Kowalski: [phone]  - Dr. Moye: [phone]  - Dr. Stewart: [phone]  In the event of inclement weather, please call our main line at [phone] for an update on the status of any delays or closures.  Dermatology Medication Tips: Please keep the boxes that topical medications come in in order to help keep track of the instructions about where and how to use these. Pharmacies typically print the medication instructions only on the boxes and not directly on the medication tubes.   If your medication is too expensive, please contact our office at [phone] option 4 or send us a message through MyChart.   We are unable to tell what your co-pay for medications will be in advance as this is different depending on your insurance coverage. However, we may be able to find a substitute  medication at lower cost or fill out paperwork to get insurance to cover a needed medication.   If a prior authorization is required to get your medication covered by your insurance company, please allow us 1-2 business days to complete this process.  Drug prices often vary depending on where the prescription is filled and some pharmacies may offer cheaper prices.  The website www.goodrx.com contains coupons for medications through different pharmacies. The prices here do not account for what the cost may be with help from insurance (it may be cheaper with your insurance), but the website can give you the price if you did not use any insurance.  - You can print the associated coupon and take it with your prescription to the pharmacy.  - You may also stop by our office during regular business hours and pick up a GoodRx coupon card.  - If you need your prescription sent electronically to a different pharmacy, notify our office through [facility] MyChart or by phone at [phone] option 4.   Wound Care Instructions  Cleanse wound gently with soap and water once a day then pat dry with clean gauze. Apply a thing coat of Petrolatum (petroleum jelly, "Vaseline") over the wound (unless you have an allergy to this). We recommend that you use a new, sterile tube of Vaseline. Do not pick or remove scabs. Do not remove the yellow or white "healing tissue" from the base of the wound.  Cover the   wound with fresh, clean, nonstick gauze and secure with paper tape. You may use Band-Aids in place of gauze and tape if the would is small enough, but would recommend trimming much of the tape off as there is often too much. Sometimes Band-Aids can irritate the skin.  You should call the office for your biopsy report after 1 week if you have not already been contacted.  If you experience any problems, such as abnormal amounts of bleeding, swelling, significant bruising, significant pain, or evidence of infection,  please call the office immediately.  FOR ADULT SURGERY PATIENTS: If you need something for pain relief you may take 1 extra strength Tylenol (acetaminophen) AND 2 Ibuprofen (200mg each) together every 4 hours as needed for pain. (do not take these if you are allergic to them or if you have a reason you should not take them.) Typically, you may only need pain medication for 1 to 3 days.    

## 2021-06-11 NOTE — Progress Notes (Signed)
Follow-Up Visit   Subjective  Claire Olsen is a 40 y.o. female who presents for the following: Annual Exam (Hx dysplastic nevi ). Patient has noticed a lesion on her her right thigh that is changing in appearance. The patient presents for Total-Body Skin Exam (TBSE) for skin cancer screening and mole check.  The following portions of the chart were reviewed this encounter and updated as appropriate:   Tobacco  Allergies  Meds  Problems  Med Hx  Surg Hx  Fam Hx     Review of Systems:  No other skin or systemic complaints except as noted in HPI or Assessment and Plan.  Objective  Well appearing patient in no apparent distress; mood and affect are within normal limits.  A full examination was performed including scalp, head, eyes, ears, nose, lips, neck, chest, axillae, abdomen, back, buttocks, bilateral upper extremities, bilateral lower extremities, hands, feet, fingers, toes, fingernails, and toenails. All findings within normal limits unless otherwise noted below.  Scalp Clear.   Face White papule.  R lat neck at hairline 0.6 cm irregular brown macule.  R prox ant thigh near the groin 0.6 cm irregular brown macule.  L forehead med 0.6 cm brown papule.  L forehead lat 0.6 cm brown papule.   Left Breast x 1, R chest x 1, R thigh x 1 (3) Erythematous keratotic or waxy stuck-on papule or plaque.   Face (3) Small yellow papules with a central dell.    Assessment & Plan  Psoriasis /SEBO psoriasis of the scalp Scalp Psoriasis is a chronic non-curable, but treatable genetic/hereditary disease that may have other systemic features affecting other organ systems such as joints (Psoriatic Arthritis). It is associated with an increased risk of inflammatory bowel disease, heart disease, non-alcoholic fatty liver disease, and depression.    Continue Ketoconazole 2% shampoo 3d/wk.   ketoconazole (NIZORAL) 2 % shampoo - Scalp Shampoo into scalp let sit 10 minutes then  wash out. Use 3d/wk.  Milium Face Benign-appearing.  Observation.  Call clinic for new or changing lesions.  Recommend daily use of broad spectrum spf 30+ sunscreen to sun-exposed areas.   Neoplasm of uncertain behavior of skin (4) R lat neck at hairline Epidermal / dermal shaving  Lesion diameter (cm):  0.6 Informed consent: discussed and consent obtained   Timeout: patient name, date of birth, surgical site, and procedure verified   Procedure prep:  Patient was prepped and draped in usual sterile fashion Prep type:  Isopropyl alcohol Anesthesia: the lesion was anesthetized in a standard fashion   Anesthetic:  1% lidocaine w/ epinephrine 1-100,000 buffered w/ 8.4% NaHCO3 Instrument used: flexible razor blade   Hemostasis achieved with: pressure, aluminum chloride and electrodesiccation   Outcome: patient tolerated procedure well   Post-procedure details: sterile dressing applied and wound care instructions given   Dressing type: bandage and petrolatum    Specimen 1 - Surgical pathology Differential Diagnosis: D48.5 r/o dysplastic nevus Check Margins: No  R prox ant thigh near the groin  Epidermal / dermal shaving  Lesion diameter (cm):  0.6 Informed consent: discussed and consent obtained   Timeout: patient name, date of birth, surgical site, and procedure verified   Procedure prep:  Patient was prepped and draped in usual sterile fashion Prep type:  Isopropyl alcohol Anesthesia: the lesion was anesthetized in a standard fashion   Anesthetic:  1% lidocaine w/ epinephrine 1-100,000 buffered w/ 8.4% NaHCO3 Instrument used: flexible razor blade   Hemostasis achieved with: pressure, aluminum chloride  and electrodesiccation   Outcome: patient tolerated procedure well   Post-procedure details: sterile dressing applied and wound care instructions given   Dressing type: bandage and petrolatum    Specimen 2 - Surgical pathology Differential Diagnosis: D48.5 r/o dysplastic  nevus Check Margins: No  L forehead med  Epidermal / dermal shaving  Lesion diameter (cm):  0.6 Informed consent: discussed and consent obtained   Timeout: patient name, date of birth, surgical site, and procedure verified   Procedure prep:  Patient was prepped and draped in usual sterile fashion Prep type:  Isopropyl alcohol Anesthesia: the lesion was anesthetized in a standard fashion   Anesthetic:  1% lidocaine w/ epinephrine 1-100,000 buffered w/ 8.4% NaHCO3 Instrument used: flexible razor blade   Hemostasis achieved with: pressure, aluminum chloride and electrodesiccation   Outcome: patient tolerated procedure well   Post-procedure details: sterile dressing applied and wound care instructions given   Dressing type: bandage and petrolatum    Specimen 3 - Surgical pathology Differential Diagnosis: D48.5 r/o irritated nevus r/o dysplastic nevus Check Margins: No  L forehead lat  Epidermal / dermal shaving  Lesion diameter (cm):  0.6 Informed consent: discussed and consent obtained   Timeout: patient name, date of birth, surgical site, and procedure verified   Procedure prep:  Patient was prepped and draped in usual sterile fashion Prep type:  Isopropyl alcohol Anesthesia: the lesion was anesthetized in a standard fashion   Anesthetic:  1% lidocaine w/ epinephrine 1-100,000 buffered w/ 8.4% NaHCO3 Instrument used: flexible razor blade   Hemostasis achieved with: pressure, aluminum chloride and electrodesiccation   Outcome: patient tolerated procedure well   Post-procedure details: sterile dressing applied and wound care instructions given   Dressing type: bandage and petrolatum    Specimen 4 - Surgical pathology Differential Diagnosis: D48.5 irritated nevus r/o dysplasia Check Margins: No  Inflamed seborrheic keratosis Left Breast x 1, R chest x 1, R thigh x 1  Destruction of lesion - Left Breast x 1, R chest x 1, R thigh x 1 Complexity: simple   Destruction method:  cryotherapy   Informed consent: discussed and consent obtained   Timeout:  patient name, date of birth, surgical site, and procedure verified Lesion destroyed using liquid nitrogen: Yes   Region frozen until ice ball extended beyond lesion: Yes   Outcome: patient tolerated procedure well with no complications   Post-procedure details: wound care instructions given    Sebaceous hyperplasia of face (3) Face Destruction of lesion - Face Complexity: simple   Destruction method comment:  Electrodesiccation Informed consent: discussed and consent obtained   Hemostasis achieved with:  electrodesiccation Outcome: patient tolerated procedure well with no complications    Lentigines - Scattered tan macules - Due to sun exposure - Benign-appering, observe - Recommend daily broad spectrum sunscreen SPF 30+ to sun-exposed areas, reapply every 2 hours as needed. - Call for any changes  Seborrheic Keratoses - Stuck-on, waxy, tan-brown papules and/or plaques  - Benign-appearing - Discussed benign etiology and prognosis. - Observe - Call for any changes  Melanocytic Nevi - Tan-brown and/or pink-flesh-colored symmetric macules and papules - Benign appearing on exam today - Observation - Call clinic for new or changing moles - Recommend daily use of broad spectrum spf 30+ sunscreen to sun-exposed areas.   Hemangiomas - Red papules - Discussed benign nature - Observe - Call for any changes  Actinic Damage - Chronic condition, secondary to cumulative UV/sun exposure - diffuse scaly erythematous macules with underlying dyspigmentation - Recommend  daily broad spectrum sunscreen SPF 30+ to sun-exposed areas, reapply every 2 hours as needed.  - Staying in the shade or wearing long sleeves, sun glasses (UVA+UVB protection) and wide brim hats (4-inch brim around the entire circumference of the hat) are also recommended for sun protection.  - Call for new or changing lesions.  Acrochordons  (Skin Tags) - Fleshy, skin-colored pedunculated papules - Benign appearing.  - Observe. - If desired, they can be removed with an in office procedure that is not covered by insurance. - Please call the clinic if you notice any new or changing lesions.  Skin cancer screening performed today.  Return in about 1 year (around 06/11/2022) for TBSE.  Luther Redo, CMA, am acting as scribe for Sarina Ser, MD . Documentation: I have reviewed the above documentation for accuracy and completeness, and I agree with the above.  Sarina Ser, MD

## 2021-06-12 ENCOUNTER — Encounter: Payer: Self-pay | Admitting: Dermatology

## 2021-06-19 ENCOUNTER — Telehealth: Payer: Self-pay

## 2021-06-19 NOTE — Telephone Encounter (Signed)
Advised patient of results and scheduled follow up appointment/hd

## 2021-06-19 NOTE — Telephone Encounter (Signed)
-----   Message from Ralene Bathe, MD sent at 06/19/2021  2:57 PM EDT ----- Diagnosis 1. Skin , R lat neck at hairline DYSPLASTIC COMPOUND NEVUS WITH SEVERE ATYPIA, DEEP MARGIN INVOLVED, SEE DESCRIPTION 2. Skin , R prox ant thigh near the groin DYSPLASTIC JUNCTIONAL LENTIGINOUS NEVUS WITH MODERATE ATYPIA, LATERAL MARGIN INVOLVED 3. Skin , L forehead med MELANOCYTIC NEVUS, INTRADERMAL TYPE, IRRITATED 4. Skin , L forehead lat MELANOCYTIC NEVUS, INTRADERMAL TYPE, IRRITATED  1- Severe dysplastic Needs additional procedure Schedule for recheck in 6-12 weeks for evaluation and discussion of procedure.  If shave removal recommended, can do that at that visit. 2- Moderate dysplastic Recheck next visit 3&4 - both benign moles No further treatment needed

## 2021-07-16 ENCOUNTER — Other Ambulatory Visit: Payer: Self-pay

## 2021-07-16 ENCOUNTER — Ambulatory Visit
Admission: EM | Admit: 2021-07-16 | Discharge: 2021-07-16 | Disposition: A | Discharge status: Home / Self Care | Payer: BC Managed Care – PPO

## 2021-07-16 ENCOUNTER — Encounter: Payer: Self-pay | Admitting: Emergency Medicine

## 2021-07-16 DIAGNOSIS — K611 Rectal abscess: Secondary | ICD-10-CM

## 2021-07-16 MED ORDER — HYDROCODONE-ACETAMINOPHEN 5-325 MG PO TABS: 1.0000 | ORAL_TABLET | Freq: Four times a day (QID) | ORAL | 0 refills | Status: DC | PRN

## 2021-07-16 MED ORDER — AMOXICILLIN-POT CLAVULANATE 875-125 MG PO TABS: 1.0000 | ORAL_TABLET | Freq: Two times a day (BID) | ORAL | 0 refills | Status: AC

## 2021-07-16 NOTE — ED Triage Notes (Signed)
Pain presents today with c/o of pain to rectal area from "abscess" x 2 days. She report history of abscess to same area approx 20 years ago.

## 2021-07-16 NOTE — ED Provider Notes (Signed)
[facility]    CSN: [id number] Arrival date & time: 07/16/21  1761      History   Chief Complaint Chief Complaint  Patient presents with   Abscess    HPI Claire Olsen is a 40 y.o. female.   HPI  40 year old female here for evaluation of rectal pain.  Patient reports that she has been experiencing rectal pain and swelling for the last 2 days.  She states that it is gotten worse over the last 24 hours.  She thinks it might be draining but she is unsure.  She is having pain with bowel movements but she denies any bleeding or blood in her stools.  She not had any abdominal pain or fever.  She does endorse nausea when the pain gets severe but not on a recurrent basis.  Patient has had a perirectal abscess in the past that ruptured on its own.  Past Medical History:  Diagnosis Date   BRCA negative 04/2016   MyRisk neg   Dysplastic nevus 07/05/2008   Mid lower back - mild    Dysplastic nevus 01/25/2020   R upper back sup med scapula - moderate   Dysplastic nevus 06/11/2021   right lat neck at hairline - severe - recheck at 08/06/2021 appt   Dysplastic nevus 06/11/2021   right prox ant thigh near groin - moderate   Family history of ovarian cancer    mother and mat aunt   Hypertension    Menorrhagia    PCOS (polycystic ovarian syndrome)    Spina bifida without hydrocephalus ([place])     Patient Active Problem List   Diagnosis Date Noted   Obesity, unspecified 09/14/2017   PCOS (polycystic ovarian syndrome) 03/10/2017   Hirsutism 12/01/2012    Past Surgical History:  Procedure Laterality Date   CESAREAN SECTION  2009/2012   HYSTEROSCOPY WITH D & C  01/20/2013   Severe menorrhagia   ([place])   TUBAL LIGATION      OB History     Gravida  2   Para  2   Term  2   Preterm      AB  0   Living  2      SAB      IAB      Ectopic      Multiple      Live Births  2            Home Medications    Prior to Admission medications    Medication Sig Start Date End Date Taking? Authorizing Provider  amoxicillin-clavulanate (AUGMENTIN) 875-125 MG tablet Take 1 tablet by mouth every 12 (twelve) hours for 10 days. 07/16/21 07/26/21 Yes Margarette Canada, NP  HYDROcodone-acetaminophen (NORCO/VICODIN) 5-325 MG tablet Take 1-2 tablets by mouth every 6 (six) hours as needed. 07/16/21  Yes Margarette Canada, NP  triamterene-hydrochlorothiazide (DYAZIDE) 37.5-25 MG capsule Take by mouth. 12/17/20 12/17/21 Yes [provider]  ketoconazole (NIZORAL) 2 % shampoo Shampoo into scalp let sit 10 minutes then wash out. Use 3d/wk. 06/11/21   Ralene Bathe, MD  levonorgestrel (MIRENA) 20 MCG/24HR IUD 1 each by Intrauterine route once.    [provider]  Multiple Vitamin (MULTI-VITAMINS) TABS Take by mouth. 10/17/10   [provider]  olmesartan-hydrochlorothiazide (BENICAR HCT) 40-12.5 MG tablet Take 1 tablet by mouth daily. 06/10/20   [provider]  rosuvastatin (CRESTOR) 5 MG tablet Take 5 mg by mouth daily. 09/13/20   [provider]  Family History Family History  Problem Relation Age of Onset   Cervical cancer Mother    Ovarian cancer Mother 68   Hyperlipidemia Mother    Stroke Mother    Ovarian cancer Maternal Aunt        late 20's   Hypertension Father    Hyperlipidemia Maternal Grandfather    Hypertension Paternal Grandmother    Melanoma Paternal Grandfather 37   Prostate cancer Paternal Grandfather    Breast cancer Neg Hx     Social History Social History   Tobacco Use   Smoking status: Never   Smokeless tobacco: Never  Vaping Use   Vaping Use: Never used  Substance Use Topics   Alcohol use: Yes    Comment: infrequent   Drug use: No     Allergies   Sulfa antibiotics   Review of Systems Review of Systems  Constitutional:  Negative for activity change, appetite change and fever.  Gastrointestinal:  Positive for nausea and rectal pain. Negative for abdominal pain, anal  bleeding, blood in stool and vomiting.  Hematological: Negative.   Psychiatric/Behavioral: Negative.      Physical Exam Triage Vital Signs ED Triage Vitals  Enc Vitals Group     BP 07/16/21 1027 106/77     Pulse Rate 07/16/21 1027 91     Resp 07/16/21 1027 18     Temp 07/16/21 1027 98.5 F (36.9 C)     Temp Source 07/16/21 1027 Oral     SpO2 07/16/21 1027 100 %     Weight --      Height --      Head Circumference --      Peak Flow --      Pain Score 07/16/21 1024 8     Pain Loc --      Pain Edu? --      Excl. in GC? --    No data found.  Updated Vital Signs BP 106/77 (BP Location: Left Arm)   Pulse 91   Temp 98.5 F (36.9 C) (Oral)   Resp 18   SpO2 100%   Visual Acuity Right Eye Distance:   Left Eye Distance:   Bilateral Distance:    Right Eye Near:   Left Eye Near:    Bilateral Near:     Physical Exam Vitals and nursing note reviewed. Exam conducted with a chaperone present Martin Majestic, RN).  Constitutional:      General: She is in acute distress.     Appearance: Normal appearance.  HENT:     Head: Normocephalic and atraumatic.  Cardiovascular:     Rate and Rhythm: Normal rate and regular rhythm.     Pulses: Normal pulses.     Heart sounds: Normal heart sounds. No murmur heard.   No gallop.  Pulmonary:     Effort: Pulmonary effort is normal.     Breath sounds: Normal breath sounds. No wheezing, rhonchi or rales.  Skin:    General: Skin is warm and dry.     Capillary Refill: Capillary refill takes less than 2 seconds.     Findings: Lesion present. No erythema.  Neurological:     General: No focal deficit present.     Mental Status: She is alert and oriented to person, place, and time.  Psychiatric:        Mood and Affect: Mood normal.        Behavior: Behavior normal.        Thought Content: Thought content normal.  Judgment: Judgment normal.     UC Treatments / Results  Labs (all labs ordered are listed, but only abnormal results are  displayed) Labs Reviewed - No data to display  EKG   Radiology No results found.  Procedures Procedures (including critical care time)  Medications Ordered in UC Medications - No data to display  Initial Impression / Assessment and Plan / UC Course  I have reviewed the triage vital signs and the nursing notes.  Pertinent labs & imaging results that were available during my care of the patient were reviewed by me and considered in my medical decision making (see chart for details).  Patient is a very pleasant 40 year old female who does appear to be in a moderate degree of pain here for evaluation of possible rectal abscess.  She is increased pain and swelling for the past 2 days and she thinks the abscess is ruptured and is draining.  She is here because of the amount of discomfort that she is in.  She states that she has been taking a lot of baths at home which do provide relief of her pain.  Patient's physical exam reveals a benign cardiopulmonary exam with clear lung sounds in all fields.  I was chaperoned by Otila Kluver the registered nurse to perform a rectal exam.  The exam reveals the presence of a ruptured and draining abscess at the 11 o'clock position when patient is in the Jackknife position.  The abscess is not erythematous, or ecchymotic.  There is mild edema.  The pus that is draining is tan in color without any blood.  The rectal opening is unremarkable otherwise.  Will discharge patient home with a diagnosis of perirectal abscess and place her on empiric antibiotic therapy with Augmentin twice daily for 10 days.  I will have patient continue her back therapy, Tylenol ibuprofen for mild to moderate pain, and will give Norco for severe pain.  ER return precautions reviewed with patient.   Final Clinical Impressions(s) / UC Diagnoses   Final diagnoses:  Perirectal abscess     Discharge Instructions      Your perirectal abscess is draining freely.  And therefore I recommend that  you continue taking your baths to encourage and promote drainage.  Use over-the-counter Tylenol and ibuprofen according to the package instructions for mild to moderate pain.  Take the Norco as directed for severe pain.  Be mindful of this also contains Tylenol so keep track of how much Tylenol you are taking and do not exceed 4000 mg in 24 hours.  Take the Augmentin twice daily with food for treatment of your abscess and infection.  If you have any sharp increase in pain, bleeding from the rectum, or fevers you need to go to the ER for evaluation.     ED Prescriptions     Medication Sig Dispense Auth. Provider   amoxicillin-clavulanate (AUGMENTIN) 875-125 MG tablet Take 1 tablet by mouth every 12 (twelve) hours for 10 days. 20 tablet Margarette Canada, NP   HYDROcodone-acetaminophen (NORCO/VICODIN) 5-325 MG tablet Take 1-2 tablets by mouth every 6 (six) hours as needed. 25 tablet Margarette Canada, NP      I have reviewed the PDMP during this encounter.   Margarette Canada, NP 07/16/21 1110

## 2021-07-16 NOTE — ED Notes (Signed)
This RN provided chaperone with J.Ryan NP for rectal exam.

## 2021-07-16 NOTE — Discharge Instructions (Addendum)
Your perirectal abscess is draining freely.  And therefore I recommend that you continue taking your baths to encourage and promote drainage.  Use over-the-counter Tylenol and ibuprofen according to the package instructions for mild to moderate pain.  Take the Norco as directed for severe pain.  Be mindful of this also contains Tylenol so keep track of how much Tylenol you are taking and do not exceed 4000 mg in 24 hours.  Take the Augmentin twice daily with food for treatment of your abscess and infection.  If you have any sharp increase in pain, bleeding from the rectum, or fevers you need to go to the ER for evaluation.

## 2021-08-07 ENCOUNTER — Other Ambulatory Visit: Payer: Self-pay

## 2021-08-07 ENCOUNTER — Ambulatory Visit: Payer: BC Managed Care – PPO | Admitting: Dermatology

## 2021-08-07 DIAGNOSIS — Z86018 Personal history of other benign neoplasm: Secondary | ICD-10-CM

## 2021-08-07 DIAGNOSIS — D224 Melanocytic nevi of scalp and neck: Secondary | ICD-10-CM | POA: Diagnosis not present

## 2021-08-07 DIAGNOSIS — D239 Other benign neoplasm of skin, unspecified: Secondary | ICD-10-CM

## 2021-08-07 NOTE — Patient Instructions (Signed)
Wound Care Instructions  Cleanse wound gently with soap and water once a day then pat dry with clean gauze. Apply a thing coat of Petrolatum (petroleum jelly, "Vaseline") over the wound (unless you have an allergy to this). We recommend that you use a new, sterile tube of Vaseline. Do not pick or remove scabs. Do not remove the yellow or white "healing tissue" from the base of the wound.  Cover the wound with fresh, clean, nonstick gauze and secure with paper tape. You may use Band-Aids in place of gauze and tape if the would is small enough, but would recommend trimming much of the tape off as there is often too much. Sometimes Band-Aids can irritate the skin.  You should call the office for your biopsy report after 1 week if you have not already been contacted.  If you experience any problems, such as abnormal amounts of bleeding, swelling, significant bruising, significant pain, or evidence of infection, please call the office immediately.  FOR ADULT SURGERY PATIENTS: If you need something for pain relief you may take 1 extra strength Tylenol (acetaminophen) AND 2 Ibuprofen (200mg each) together every 4 hours as needed for pain. (do not take these if you are allergic to them or if you have a reason you should not take them.) Typically, you may only need pain medication for 1 to 3 days.   If you have any questions or concerns for your doctor, please call our main line at [phone] and press option 4 to reach your doctor's medical assistant. If no one answers, please leave a voicemail as directed and we will return your call as soon as possible. Messages left after 4 pm will be answered the following business day.   You may also send us a message via MyChart. We typically respond to MyChart messages within 1-2 business days.  For prescription refills, please ask your pharmacy to contact our office. Our fax number is [phone].  If you have an urgent issue when the clinic is closed that  cannot wait until the next business day, you can page your doctor at the number below.    Please note that while we do our best to be available for urgent issues outside of office hours, we are not available 24/7.   If you have an urgent issue and are unable to reach us, you may choose to seek medical care at your doctor's office, retail clinic, urgent care center, or emergency room.  If you have a medical emergency, please immediately call 911 or go to the emergency department.  Pager Numbers  - Dr. Kowalski: [phone]  - Dr. Moye: [phone]  - Dr. Stewart: [phone]  In the event of inclement weather, please call our main line at [phone] for an update on the status of any delays or closures.  Dermatology Medication Tips: Please keep the boxes that topical medications come in in order to help keep track of the instructions about where and how to use these. Pharmacies typically print the medication instructions only on the boxes and not directly on the medication tubes.   If your medication is too expensive, please contact our office at [phone] option 4 or send us a message through MyChart.   We are unable to tell what your co-pay for medications will be in advance as this is different depending on your insurance coverage. However, we may be able to find a substitute medication at lower cost or fill out paperwork to get insurance to cover a needed   medication.   If a prior authorization is required to get your medication covered by your insurance company, please allow us 1-2 business days to complete this process.  Drug prices often vary depending on where the prescription is filled and some pharmacies may offer cheaper prices.  The website www.goodrx.com contains coupons for medications through different pharmacies. The prices here do not account for what the cost may be with help from insurance (it may be cheaper with your insurance), but the website can give you the  price if you did not use any insurance.  - You can print the associated coupon and take it with your prescription to the pharmacy.  - You may also stop by our office during regular business hours and pick up a GoodRx coupon card.  - If you need your prescription sent electronically to a different pharmacy, notify our office through [place] MyChart or by phone at [phone] option 4.   

## 2021-08-07 NOTE — Progress Notes (Signed)
   Follow-Up Visit   Subjective  Claire Olsen is a 40 y.o. female who presents for the following: Follow-up (Biopsy proven Dysplastic nevus with severe Atypia at right lateral neck at hairline 06-11-21). Check right thigh near groin biopsy proven Dysplastic nevus with mod Atypia removed 06-11-21  The following portions of the chart were reviewed this encounter and updated as appropriate:   Tobacco  Allergies  Meds  Problems  Med Hx  Surg Hx  Fam Hx     Review of Systems:  No other skin or systemic complaints except as noted in HPI or Assessment and Plan.  Objective  Well appearing patient in no apparent distress; mood and affect are within normal limits.  A focused examination was performed including face, neck right thigh. Relevant physical exam findings are noted in the Assessment and Plan.  right lateral neck at hairline Scar with no evidence of recurrence.   Right Thigh - Anterior Scar with no evidence of recurrence.    Assessment & Plan  Dysplastic nevus right lateral neck at hairline  Epidermal / dermal shaving - right lateral neck at hairline  Lesion diameter (cm):  1.1 Informed consent: discussed and consent obtained   Timeout: patient name, date of birth, surgical site, and procedure verified   Procedure prep:  Patient was prepped and draped in usual sterile fashion Prep type:  Isopropyl alcohol Anesthesia: the lesion was anesthetized in a standard fashion   Anesthetic:  1% lidocaine w/ epinephrine 1-100,000 buffered w/ 8.4% NaHCO3 Hemostasis achieved with: pressure, aluminum chloride and electrodesiccation   Outcome: patient tolerated procedure well   Post-procedure details: sterile dressing applied and wound care instructions given   Dressing type: bandage and petrolatum    Specimen 1 - Surgical pathology Differential Diagnosis: R/O Residual Dysplastic nevus   Check Margins: No  History of dysplastic nevus Right Thigh - Anterior  Clear. Observe for  recurrence. Call clinic for new or changing lesions.  Recommend regular skin exams, daily broad-spectrum spf 30+ sunscreen use, and photoprotection.     Return in about 6 months (around 02/04/2022) for TBSE, hx of Dyplastic nevus .  IMarye Round, CMA, am acting as scribe for Sarina Ser, MD .  Documentation: I have reviewed the above documentation for accuracy and completeness, and I agree with the above.  Sarina Ser, MD

## 2021-08-11 ENCOUNTER — Encounter: Payer: Self-pay | Admitting: Dermatology

## 2021-08-12 ENCOUNTER — Telehealth: Payer: Self-pay

## 2021-08-12 NOTE — Telephone Encounter (Signed)
-----   Message from Ralene Bathe, MD sent at 08/12/2021 10:10 AM EDT ----- Diagnosis Skin , right lateral neck at hairline RESIDUAL DYSPLASTIC NEVUS, MARGIN FREE  Severe dysplastic Margins free Recheck next visit

## 2021-08-12 NOTE — Telephone Encounter (Signed)
Patient advised of BX results.  °

## 2022-02-12 ENCOUNTER — Ambulatory Visit: Payer: BC Managed Care – PPO | Admitting: Dermatology

## 2022-06-17 ENCOUNTER — Ambulatory Visit: Payer: BC Managed Care – PPO | Admitting: Dermatology

## 2022-06-17 DIAGNOSIS — Z86018 Personal history of other benign neoplasm: Secondary | ICD-10-CM | POA: Diagnosis not present

## 2022-06-17 DIAGNOSIS — D1800 Hemangioma unspecified site: Secondary | ICD-10-CM

## 2022-06-17 DIAGNOSIS — L409 Psoriasis, unspecified: Secondary | ICD-10-CM | POA: Diagnosis not present

## 2022-06-17 DIAGNOSIS — L820 Inflamed seborrheic keratosis: Secondary | ICD-10-CM | POA: Diagnosis not present

## 2022-06-17 DIAGNOSIS — L821 Other seborrheic keratosis: Secondary | ICD-10-CM

## 2022-06-17 DIAGNOSIS — L578 Other skin changes due to chronic exposure to nonionizing radiation: Secondary | ICD-10-CM | POA: Diagnosis not present

## 2022-06-17 DIAGNOSIS — Z1283 Encounter for screening for malignant neoplasm of skin: Secondary | ICD-10-CM

## 2022-06-17 DIAGNOSIS — Z808 Family history of malignant neoplasm of other organs or systems: Secondary | ICD-10-CM

## 2022-06-17 DIAGNOSIS — L814 Other melanin hyperpigmentation: Secondary | ICD-10-CM

## 2022-06-17 DIAGNOSIS — D229 Melanocytic nevi, unspecified: Secondary | ICD-10-CM

## 2022-06-17 DIAGNOSIS — L918 Other hypertrophic disorders of the skin: Secondary | ICD-10-CM

## 2022-06-17 MED ORDER — KETOCONAZOLE 2 % EX SHAM: MEDICATED_SHAMPOO | CUTANEOUS | 11 refills | Status: DC

## 2022-06-17 NOTE — Patient Instructions (Addendum)
Wound Care Instructions  Cleanse wound gently with soap and water once a day then pat dry with clean gauze. Apply a thing coat of Petrolatum (petroleum jelly, "Vaseline") over the wound (unless you have an allergy to this). We recommend that you use a new, sterile tube of Vaseline. Do not pick or remove scabs. Do not remove the yellow or white "healing tissue" from the base of the wound.  Cover the wound with fresh, clean, nonstick gauze and secure with paper tape. You may use Band-Aids in place of gauze and tape if the would is small enough, but would recommend trimming much of the tape off as there is often too much. Sometimes Band-Aids can irritate the skin.  You should call the office for your biopsy report after 1 week if you have not already been contacted.  If you experience any problems, such as abnormal amounts of bleeding, swelling, significant bruising, significant pain, or evidence of infection, please call the office immediately.  FOR ADULT SURGERY PATIENTS: If you need something for pain relief you may take 1 extra strength Tylenol (acetaminophen) AND 2 Ibuprofen ('200mg'$  each) together every 4 hours as needed for pain. (do not take these if you are allergic to them or if you have a reason you should not take them.) Typically, you may only need pain medication for 1 to 3 days.   Cryotherapy Aftercare  Wash gently with soap and water everyday.   Apply Vaseline and Band-Aid daily until healed.       Due to recent changes in healthcare laws, you may see results of your pathology and/or laboratory studies on MyChart before the doctors have had a chance to review them. We understand that in some cases there may be results that are confusing or concerning to you. Please understand that not all results are received at the same time and often the doctors may need to interpret multiple results in order to provide you with the best plan of care or course of treatment. Therefore, we ask that  you please give [place] 2 business days to thoroughly review all your results before contacting the office for clarification. Should we see a critical lab result, you will be contacted sooner.   If You Need Anything After Your Visit  If you have any questions or concerns for your doctor, please call our main line at [phone] and press option 4 to reach your doctor's medical assistant. If no one answers, please leave a voicemail as directed and we will return your call as soon as possible. Messages left after 4 pm will be answered the following business day.   You may also send [place] a message via [place]. We typically respond to MyChart messages within 1-2 business days.  For prescription refills, please ask your pharmacy to contact our office. Our fax number is [phone].  If you have an urgent issue when the clinic is closed that cannot wait until the next business day, you can page your doctor at the number below.    Please note that while we do our best to be available for urgent issues outside of office hours, we are not available 24/7.   If you have an urgent issue and are unable to reach [place], you may choose to seek medical care at your doctor's office, retail clinic, urgent care center, or emergency room.  If you have a medical emergency, please immediately call 911 or go to the emergency department.  Pager Numbers  - Dr. Nehemiah Massed: [phone]  -  Dr. Moye: [phone]  - Dr. Stewart: [phone]  In the event of inclement weather, please call our main line at [phone] for an update on the status of any delays or closures.  Dermatology Medication Tips: Please keep the boxes that topical medications come in in order to help keep track of the instructions about where and how to use these. Pharmacies typically print the medication instructions only on the boxes and not directly on the medication tubes.   If your medication is too expensive, please contact our office at  [phone] option 4 or send us a message through MyChart.   We are unable to tell what your co-pay for medications will be in advance as this is different depending on your insurance coverage. However, we may be able to find a substitute medication at lower cost or fill out paperwork to get insurance to cover a needed medication.   If a prior authorization is required to get your medication covered by your insurance company, please allow us 1-2 business days to complete this process.  Drug prices often vary depending on where the prescription is filled and some pharmacies may offer cheaper prices.  The website www.goodrx.com contains coupons for medications through different pharmacies. The prices here do not account for what the cost may be with help from insurance (it may be cheaper with your insurance), but the website can give you the price if you did not use any insurance.  - You can print the associated coupon and take it with your prescription to the pharmacy.  - You may also stop by our office during regular business hours and pick up a GoodRx coupon card.  - If you need your prescription sent electronically to a different pharmacy, notify our office through [facility] MyChart or by phone at [phone] option 4.     Si Usted Necesita Algo Despus de Su Visita  Tambin puede enviarnos un mensaje a travs de MyChart. Por lo general respondemos a los mensajes de MyChart en el transcurso de 1 a 2 das hbiles.  Para renovar recetas, por favor pida a su farmacia que se ponga en contacto con nuestra oficina. Nuestro nmero de fax es el [phone].  Si tiene un asunto urgente cuando la clnica est cerrada y que no puede esperar hasta el siguiente da hbil, puede llamar/localizar a su doctor(a) al nmero que aparece a continuacin.   Por favor, tenga en cuenta que aunque hacemos todo lo posible para estar disponibles para asuntos urgentes fuera del horario de oficina, no estamos  disponibles las 24 horas del da, los 7 das de la semana.   Si tiene un problema urgente y no puede comunicarse con nosotros, puede optar por buscar atencin mdica  en el consultorio de su doctor(a), en una clnica privada, en un centro de atencin urgente o en una sala de emergencias.  Si tiene una emergencia mdica, por favor llame inmediatamente al 911 o vaya a la sala de emergencias.  Nmeros de bper  - Dr. Kowalski: [phone]  - Dra. Moye: [phone]  - Dra. Stewart: [phone]  En caso de inclemencias del tiempo, por favor llame a nuestra lnea principal al [phone] para una actualizacin sobre el estado de cualquier retraso o cierre.  Consejos para la medicacin en dermatologa: Por favor, guarde las cajas en las que vienen los medicamentos de uso tpico para ayudarle a seguir las instrucciones sobre dnde y cmo usarlos. Las farmacias generalmente imprimen las instrucciones del medicamento slo en las cajas y   no directamente en los tubos del medicamento.   Si su medicamento es muy caro, por favor, pngase en contacto con nuestra oficina llamando al [phone] y presione la opcin 4 o envenos un mensaje a travs de MyChart.   No podemos decirle cul ser su copago por los medicamentos por adelantado ya que esto es diferente dependiendo de la cobertura de su seguro. Sin embargo, es posible que podamos encontrar un medicamento sustituto a menor costo o llenar un formulario para que el seguro cubra el medicamento que se considera necesario.   Si se requiere una autorizacin previa para que su compaa de seguros cubra su medicamento, por favor permtanos de 1 a 2 das hbiles para completar este proceso.  Los precios de los medicamentos varan con frecuencia dependiendo del lugar de dnde se surte la receta y alguna farmacias pueden ofrecer precios ms baratos.  El sitio web www.goodrx.com tiene cupones para medicamentos de diferentes farmacias. Los precios aqu no  tienen en cuenta lo que podra costar con la ayuda del seguro (puede ser ms barato con su seguro), pero el sitio web puede darle el precio si no utiliz ningn seguro.  - Puede imprimir el cupn correspondiente y llevarlo con su receta a la farmacia.  - Tambin puede pasar por nuestra oficina durante el horario de atencin regular y recoger una tarjeta de cupones de GoodRx.  - Si necesita que su receta se enve electrnicamente a una farmacia diferente, informe a nuestra oficina a travs de MyChart de [facility] o por telfono llamando al [phone] y presione la opcin 4.  

## 2022-06-17 NOTE — Progress Notes (Unsigned)
Follow-Up Visit   Subjective  Claire Olsen is a 41 y.o. female who presents for the following: Total body skin exam (Hx of Dysplastic Nevus R proximal ant thigh near groin, R lat neck at hairline, Mid lower back, R upper back sup med scapula ), check spot (R ant thigh, getting larger), skin tags (Arms, inner thighs), rough dry patch (Back, ~5m no symptoms), and Psoriasis (/sebo psoriasis, scalp, ran out of Ketoconazole 2% shampoo, ). The patient presents for Total-Body Skin Exam (TBSE) for skin cancer screening and mole check.  The patient has spots, moles and lesions to be evaluated, some may be new or changing and the patient has concerns that these could be cancer.   The following portions of the chart were reviewed this encounter and updated as appropriate:   Tobacco  Allergies  Meds  Problems  Med Hx  Surg Hx  Fam Hx     Review of Systems:  No other skin or systemic complaints except as noted in HPI or Assessment and Plan.  Objective  Well appearing patient in no apparent distress; mood and affect are within normal limits.  A full examination was performed including scalp, head, eyes, ears, nose, lips, neck, chest, axillae, abdomen, back, buttocks, bilateral upper extremities, bilateral lower extremities, hands, feet, fingers, toes, fingernails, and toenails. All findings within normal limits unless otherwise noted below.  Scalp Mild scale scalp  R ant thigh x 1, R dorsum hand x 1 (2) Stuck on waxy paps with erythema  R ant thigh x 2 (2) Fleshy, skin-colored pedunculated papules.     Assessment & Plan   History of Dysplastic Nevi - No evidence of recurrence today - Recommend regular full body skin exams - Recommend daily broad spectrum sunscreen SPF 30+ to sun-exposed areas, reapply every 2 hours as needed.  - Call if any new or changing lesions are noted between office visits  - R prox ant thigh near groin, R lat neck at hairline, R upper back sup med scapula, Mid  lower back  Psoriasis Scalp /Sebo Psoriasis Psoriasis is a chronic non-curable, but treatable genetic/hereditary disease that may have other systemic features affecting other organ systems such as joints (Psoriatic Arthritis). It is associated with an increased risk of inflammatory bowel disease, heart disease, non-alcoholic fatty liver disease, and depression.     Restart Ketoconazole 2% shampoo 1-2x./wk, let sit 5 minutes and rinse out  Related Medications ketoconazole (NIZORAL) 2 % shampoo Shampoo into scalp let sit 5 minutes then wash out. Use 3d/wk.  Inflamed seborrheic keratosis (2) R ant thigh x 1, R dorsum hand x 1 Symptomatic, irritating, patient would like treated. Destruction of lesion - R ant thigh x 1, R dorsum hand x 1 Complexity: simple   Destruction method: cryotherapy   Informed consent: discussed and consent obtained   Timeout:  patient name, date of birth, surgical site, and procedure verified Lesion destroyed using liquid nitrogen: Yes   Region frozen until ice ball extended beyond lesion: Yes   Outcome: patient tolerated procedure well with no complications   Post-procedure details: wound care instructions given    Skin tag (2) R ant thigh x 2 Acrochordons (Skin Tags) - Removal desired by patient - Patient desires removal. Reviewed that this is not covered by insurance and they will be charged a cosmetic fee for removal. Patient signed non-covered consent.  - Prior to the procedure, reviewed the expected small wound. Also reviewed the risk of leaving a small scar and  the small risk of infection.   Epidermal / dermal shaving - R ant thigh x 2  Informed consent: discussed and consent obtained   Anesthesia: the lesion was anesthetized in a standard fashion   Anesthetic:  1% lidocaine w/ epinephrine 1-100,000 buffered w/ 8.4% NaHCO3 Instrument used: scissors   Hemostasis achieved with: pressure, aluminum chloride and electrodesiccation   Outcome: patient  tolerated procedure well   Post-procedure details: wound care instructions given   Additional details:  X 2  Lentigines - Scattered tan macules - Due to sun exposure - Benign-appearing, observe - Recommend daily broad spectrum sunscreen SPF 30+ to sun-exposed areas, reapply every 2 hours as needed. - Call for any changes - arms, back, face  Seborrheic Keratoses - Stuck-on, waxy, tan-brown papules and/or plaques  - Benign-appearing - Discussed benign etiology and prognosis. - Observe - Call for any changes - trunk, arms  Melanocytic Nevi - Tan-brown and/or pink-flesh-colored symmetric macules and papules - Benign appearing on exam today - Observation - Call clinic for new or changing moles - Recommend daily use of broad spectrum spf 30+ sunscreen to sun-exposed areas.  - trunk  Hemangiomas - Red papules - Discussed benign nature - Observe - Call for any changes  Actinic Damage - Chronic condition, secondary to cumulative UV/sun exposure - diffuse scaly erythematous macules with underlying dyspigmentation - Recommend daily broad spectrum sunscreen SPF 30+ to sun-exposed areas, reapply every 2 hours as needed.  - Staying in the shade or wearing long sleeves, sun glasses (UVA+UVB protection) and wide brim hats (4-inch brim around the entire circumference of the hat) are also recommended for sun protection.  - Call for new or changing lesions.  Skin cancer screening performed today.   Family history of skin cancer - Melanoma, Paternal grandfather   Return in about 1 year (around 06/18/2023) for TBSE, Hx of Dysplastic nevi.  I, Othelia Pulling, RMA, am acting as scribe for Sarina Ser, MD  Documentation: I have reviewed the above documentation for accuracy and completeness, and I agree with the above.  Sarina Ser, MD

## 2022-06-18 ENCOUNTER — Encounter: Payer: Self-pay | Admitting: Dermatology

## 2022-07-16 ENCOUNTER — Ambulatory Visit
Admission: RE | Admit: 2022-07-16 | Discharge: 2022-07-16 | Disposition: A | Discharge status: Home / Self Care | Payer: BC Managed Care – PPO | Source: Ambulatory Visit | Attending: Internal Medicine | Admitting: Internal Medicine

## 2022-07-16 ENCOUNTER — Other Ambulatory Visit: Payer: Self-pay | Admitting: Internal Medicine

## 2022-07-16 DIAGNOSIS — N23 Unspecified renal colic: Secondary | ICD-10-CM | POA: Diagnosis present

## 2022-07-16 DIAGNOSIS — N10 Acute tubulo-interstitial nephritis: Secondary | ICD-10-CM | POA: Diagnosis present

## 2022-08-24 NOTE — Progress Notes (Unsigned)
PCP:  Patient, No Pcp Per   No chief complaint on file.    HPI:      [name] is a 41 y.o. [id number] whose LMP was No LMP recorded. (Menstrual status: IUD)., presents today for her annual examination.  Her menses are {norm/abn:715}, lasting {number: 22536} days.  Dysmenorrhea {dysmen:716}. She {does:18564} have intermenstrual bleeding. Has Mirena IUD for heavy periods; Replaced 09/14/17  Sex activity: single partner, contraception - tubal ligation.  Last Pap: 07/28/17 Results were: no abnormalities /neg HPV DNA  Hx of STDs: {STD hx:14358}  Last mammogram: 08/17/17 Results were: cat 3 for 1 yr f/u for stability bilat; f/u not done There is no FH of breast cancer. There is no FH of ovarian cancer. The patient {does:18564} do self-breast exams.  Tobacco use: {tob:20664} Alcohol use: {Alcohol:11675} No drug use.  Exercise: {exercise:31265}  She {does:18564} get adequate calcium and Vitamin D in her diet.  Patient Active Problem List   Diagnosis Date Noted   Obesity, unspecified 09/14/2017   PCOS (polycystic ovarian syndrome) 03/10/2017   Hirsutism 12/01/2012    Past Surgical History:  Procedure Laterality Date   CESAREAN SECTION  2009/2012   HYSTEROSCOPY WITH D & C  01/20/2013   Severe menorrhagia   ([facility])   TUBAL LIGATION      Family History  Problem Relation Age of Onset   Cervical cancer Mother    Ovarian cancer Mother 20   Hyperlipidemia Mother    Stroke Mother    Ovarian cancer Maternal Aunt        late 20's   Hypertension Father    Hyperlipidemia Maternal Grandfather    Hypertension Paternal Grandmother    Melanoma Paternal Grandfather 69   Prostate cancer Paternal Grandfather    Breast cancer Neg Hx     Social History   Socioeconomic History   Marital status: Single    Spouse name: Not on file   Number of children: Not on file   Years of education: Not on file   Highest education level: Not on file  Occupational History   Not on file   Tobacco Use   Smoking status: Never   Smokeless tobacco: Never  Vaping Use   Vaping Use: Never used  Substance and Sexual Activity   Alcohol use: Yes    Comment: infrequent   Drug use: No   Sexual activity: Yes    Partners: Male    Birth control/protection: I.U.D., Surgical  Other Topics Concern   Not on file  Social History Narrative   Not on file   Social Determinants of Health   Financial Resource Strain: Not on file  Food Insecurity: Not on file  Transportation Needs: Not on file  Physical Activity: Not on file  Stress: Not on file  Social Connections: Not on file  Intimate Partner Violence: Not on file     Current Outpatient Medications:    HYDROcodone-acetaminophen (NORCO/VICODIN) 5-325 MG tablet, Take 1-2 tablets by mouth every 6 (six) hours as needed., Disp: 25 tablet, Rfl: 0   ketoconazole (NIZORAL) 2 % shampoo, Shampoo into scalp let sit 5 minutes then wash out. Use 3d/wk., Disp: 120 mL, Rfl: 11   levonorgestrel (MIRENA) 20 MCG/24HR IUD, 1 each by Intrauterine route once., Disp: , Rfl:    Multiple Vitamin (MULTI-VITAMINS) TABS, Take by mouth., Disp: , Rfl:    olmesartan-hydrochlorothiazide (BENICAR HCT) 40-12.5 MG tablet, Take 1 tablet by mouth daily., Disp: , Rfl:    rosuvastatin (CRESTOR) 5  MG tablet, Take 5 mg by mouth daily., Disp: , Rfl:    triamterene-hydrochlorothiazide (DYAZIDE) 37.5-25 MG capsule, Take by mouth., Disp: , Rfl:      ROS:  Review of Systems BREAST: No symptoms   Objective: There were no vitals taken for this visit.   OBGyn Exam  Results: No results found for this or any previous visit (from the past 24 hour(s)).  Assessment/Plan: No diagnosis found.  No orders of the defined types were placed in this encounter.            GYN counsel {counseling: 16159}     F/U  No follow-ups on file.  Nagi Furio B. Toriann Spadoni, PA-C 08/24/2022 12:45 PM

## 2022-08-25 ENCOUNTER — Other Ambulatory Visit (HOSPITAL_COMMUNITY)
Admission: RE | Admit: 2022-08-25 | Discharge: 2022-08-25 | Disposition: A | Discharge status: Home / Self Care | Payer: BC Managed Care – PPO | Source: Ambulatory Visit | Attending: Obstetrics and Gynecology | Admitting: Obstetrics and Gynecology

## 2022-08-25 ENCOUNTER — Ambulatory Visit (INDEPENDENT_AMBULATORY_CARE_PROVIDER_SITE_OTHER): Payer: BC Managed Care – PPO | Admitting: Obstetrics and Gynecology

## 2022-08-25 ENCOUNTER — Encounter: Payer: Self-pay | Admitting: Obstetrics and Gynecology

## 2022-08-25 VITALS — BP 120/80 | Ht 70.0 in | Wt 274.0 lb

## 2022-08-25 DIAGNOSIS — Z30431 Encounter for routine checking of intrauterine contraceptive device: Secondary | ICD-10-CM

## 2022-08-25 DIAGNOSIS — Z01411 Encounter for gynecological examination (general) (routine) with abnormal findings: Secondary | ICD-10-CM

## 2022-08-25 DIAGNOSIS — Z124 Encounter for screening for malignant neoplasm of cervix: Secondary | ICD-10-CM | POA: Diagnosis present

## 2022-08-25 DIAGNOSIS — L738 Other specified follicular disorders: Secondary | ICD-10-CM

## 2022-08-25 DIAGNOSIS — Z1151 Encounter for screening for human papillomavirus (HPV): Secondary | ICD-10-CM | POA: Diagnosis not present

## 2022-08-25 DIAGNOSIS — Z1231 Encounter for screening mammogram for malignant neoplasm of breast: Secondary | ICD-10-CM

## 2022-08-25 DIAGNOSIS — Z01419 Encounter for gynecological examination (general) (routine) without abnormal findings: Secondary | ICD-10-CM

## 2022-08-25 DIAGNOSIS — R928 Other abnormal and inconclusive findings on diagnostic imaging of breast: Secondary | ICD-10-CM

## 2022-08-25 NOTE — Patient Instructions (Signed)
I value your feedback and you entrusting us with your care. If you get a [facility] patient survey, I would appreciate you taking the time to let us know about your experience today. Thank you!  [facility]: [phone]      

## 2022-08-26 LAB — CYTOLOGY - PAP
Comment: NEGATIVE
Diagnosis: NEGATIVE
High risk HPV: NEGATIVE

## 2022-09-10 ENCOUNTER — Other Ambulatory Visit: Payer: Self-pay | Admitting: Obstetrics and Gynecology

## 2022-09-10 DIAGNOSIS — Z1231 Encounter for screening mammogram for malignant neoplasm of breast: Secondary | ICD-10-CM

## 2022-09-10 DIAGNOSIS — R928 Other abnormal and inconclusive findings on diagnostic imaging of breast: Secondary | ICD-10-CM

## 2022-09-14 ENCOUNTER — Inpatient Hospital Stay: Admission: RE | Admit: 2022-09-14 | Payer: BC Managed Care – PPO | Source: Ambulatory Visit

## 2022-09-29 ENCOUNTER — Ambulatory Visit
Admission: RE | Admit: 2022-09-29 | Discharge: 2022-09-29 | Disposition: A | Discharge status: Home / Self Care | Payer: BC Managed Care – PPO | Source: Ambulatory Visit | Attending: Obstetrics and Gynecology | Admitting: Obstetrics and Gynecology

## 2022-09-29 DIAGNOSIS — R928 Other abnormal and inconclusive findings on diagnostic imaging of breast: Secondary | ICD-10-CM

## 2022-09-29 DIAGNOSIS — Z1231 Encounter for screening mammogram for malignant neoplasm of breast: Secondary | ICD-10-CM | POA: Insufficient documentation

## 2022-09-30 ENCOUNTER — Other Ambulatory Visit: Payer: Self-pay | Admitting: Obstetrics and Gynecology

## 2022-09-30 DIAGNOSIS — R928 Other abnormal and inconclusive findings on diagnostic imaging of breast: Secondary | ICD-10-CM

## 2022-09-30 DIAGNOSIS — N6489 Other specified disorders of breast: Secondary | ICD-10-CM

## 2022-10-19 ENCOUNTER — Ambulatory Visit
Admission: RE | Admit: 2022-10-19 | Discharge: 2022-10-19 | Disposition: A | Discharge status: Home / Self Care | Payer: BC Managed Care – PPO | Source: Ambulatory Visit | Attending: Obstetrics and Gynecology | Admitting: Obstetrics and Gynecology

## 2022-10-19 DIAGNOSIS — N6489 Other specified disorders of breast: Secondary | ICD-10-CM | POA: Insufficient documentation

## 2022-10-19 DIAGNOSIS — R928 Other abnormal and inconclusive findings on diagnostic imaging of breast: Secondary | ICD-10-CM | POA: Insufficient documentation

## 2022-10-19 HISTORY — PX: BREAST BIOPSY: SHX20

## 2022-10-19 MED ORDER — LIDOCAINE-EPINEPHRINE 1 %-1:100000 IJ SOLN
10.0000 mL | Freq: Once | INTRAMUSCULAR | Status: AC
  Administered 2022-10-19: 10 mL

## 2022-10-19 MED ORDER — LIDOCAINE HCL (PF) 1 % IJ SOLN
3.0000 mL | Freq: Once | INTRAMUSCULAR | Status: AC
  Administered 2022-10-19: 3 mL

## 2022-10-20 LAB — SURGICAL PATHOLOGY

## 2022-10-28 NOTE — Progress Notes (Signed)
Patient, No Pcp Per   Chief Complaint  Patient presents with   Breast Exam    Had a breast bx about a week ago, pressure on LB     HPI:      [name] is a 41 y.o. [id number] whose LMP was No LMP recorded. (Menstrual status: IUD)., presents today for LT breast discomfort since yesterday. LT breast inner quadrant feels like pulling sensation/pressure, similar with BF. Area felt hot yesterday so pt doing tylenol/warm compresses. Sx improved today. No erythema, fevers, masses. No hx of mastitis. S/p LT breast bx 10/19/22 outer lower quadrant. Bx showed PASH. Given enlarging area on imaging, pt would like to see gen surg for 2nd opinion re: excision. Referral has been placed to Dr. Donne Hazel, but pt hasn't heard from them re: appt yet. Pt has been healing well.   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 ribbon clip/ path pending   BREAST BIOPSY Left 10/19/2022   [place] LT BREAST BX W LOC DEV 1ST LESION IMG BX SPEC US GUIDE 10/19/2022 [facility]   CESAREAN SECTION  2009/2012   HYSTEROSCOPY WITH D & C  01/20/2013   Severe menorrhagia   ([facility])   TUBAL LIGATION      Family History  Problem Relation Age of Onset   Cervical cancer Mother    Ovarian cancer Mother 66   Hyperlipidemia Mother    Stroke Mother    Hypertension Father    Ovarian cancer Maternal Aunt        late 20's   Hyperlipidemia Maternal Grandfather    Hypertension Paternal Grandmother    Melanoma Paternal Grandfather 54   Prostate cancer Paternal Grandfather    Breast cancer Neg Hx     Social History   Socioeconomic History   Marital status: Single    Spouse name: Not on file   Number of children: Not on file    Years of education: Not on file   Highest education level: Not on file  Occupational History   Not on file  Tobacco Use   Smoking status: Never   Smokeless tobacco: Never  Vaping Use   Vaping Use: Never used  Substance and Sexual Activity   Alcohol use: Yes    Comment: infrequent   Drug use: No   Sexual activity: Yes    Partners: Male    Birth control/protection: I.U.D., Surgical    Comment: Mirena, Tubal Ligation  Other Topics Concern   Not on file  Social History Narrative   Not on file   Social Determinants of Health   Financial Resource Strain: Not on file  Food Insecurity: Not on file  Transportation Needs: Not on file  Physical Activity: Not on file  Stress: Not on file  Social Connections: Not on file  Intimate Partner Violence: Not on file    Outpatient Medications Prior to Visit  Medication Sig Dispense Refill   ketoconazole (NIZORAL) 2 % shampoo Shampoo into scalp let sit 5 minutes then wash out. Use 3d/wk. 120 mL 11   levonorgestrel (MIRENA) 20 MCG/24HR IUD 1 each by Intrauterine route once.     Multiple Vitamin (MULTI-VITAMINS) TABS Take by mouth.  olmesartan-hydrochlorothiazide (BENICAR HCT) 40-12.5 MG tablet Take 1 tablet by mouth daily.     rosuvastatin (CRESTOR) 5 MG tablet Take 5 mg by mouth daily.     triamterene-hydrochlorothiazide (DYAZIDE) 37.5-25 MG capsule Take by mouth.     No facility-administered medications prior to visit.      ROS:  Review of Systems  Constitutional:  Negative for fever.  Gastrointestinal:  Negative for blood in stool, constipation, diarrhea, nausea and vomiting.  Genitourinary:  Negative for dyspareunia, dysuria, flank pain, frequency, hematuria, urgency, vaginal bleeding, vaginal discharge and vaginal pain.  Musculoskeletal:  Negative for back pain.  Skin:  Negative for rash.   BREAST: discomfort   OBJECTIVE:   Vitals:  BP (!) 120/90   Ht '5\' 10"'$  (1.778 m)   Wt 285 lb (129.3 kg)   BMI 40.89 kg/m    Physical Exam Vitals reviewed.  Pulmonary:     Effort: Pulmonary effort is normal.  Chest:  Breasts:    Breasts are symmetrical.     Right: No inverted nipple, mass, nipple discharge, skin change or tenderness.     Left: Skin change present. No inverted nipple, mass, nipple discharge or tenderness.    Musculoskeletal:        General: Normal range of motion.     Cervical back: Normal range of motion.  Skin:    General: Skin is warm and dry.  Neurological:     General: No focal deficit present.     Mental Status: She is alert and oriented to person, place, and time.     Cranial Nerves: No cranial nerve deficit.  Psychiatric:        Mood and Affect: Mood normal.        Behavior: Behavior normal.        Thought Content: Thought content normal.        Judgment: Judgment normal.     Assessment/Plan: Normal breast exam--neg breast exam bilat. Bx area healing well.   Symptoms in breast--neg breast exam. Cont warm compresses/tylenol prn. F/u if sx persist.   F/U if doesn't hear about breast appt with Dr. Donne Hazel.    Return if symptoms worsen or fail to improve.  Cameo Shewell B. Jiovanna Frei, PA-C 11/02/2022 5:32 PM

## 2022-10-29 ENCOUNTER — Ambulatory Visit (INDEPENDENT_AMBULATORY_CARE_PROVIDER_SITE_OTHER): Payer: BC Managed Care – PPO | Admitting: Obstetrics and Gynecology

## 2022-10-29 ENCOUNTER — Encounter: Payer: Self-pay | Admitting: Obstetrics and Gynecology

## 2022-10-29 VITALS — BP 120/90 | Ht 70.0 in | Wt 285.0 lb

## 2022-10-29 DIAGNOSIS — N6459 Other signs and symptoms in breast: Secondary | ICD-10-CM | POA: Diagnosis not present

## 2022-10-29 DIAGNOSIS — Z0000 Encounter for general adult medical examination without abnormal findings: Secondary | ICD-10-CM

## 2022-11-02 ENCOUNTER — Encounter: Payer: Self-pay | Admitting: Obstetrics and Gynecology

## 2022-12-06 ENCOUNTER — Ambulatory Visit
Admission: EM | Admit: 2022-12-06 | Discharge: 2022-12-06 | Disposition: A | Discharge status: Home / Self Care | Payer: BC Managed Care – PPO | Attending: Physician Assistant | Admitting: Physician Assistant

## 2022-12-06 DIAGNOSIS — R051 Acute cough: Secondary | ICD-10-CM | POA: Diagnosis not present

## 2022-12-06 DIAGNOSIS — J209 Acute bronchitis, unspecified: Secondary | ICD-10-CM | POA: Diagnosis not present

## 2022-12-06 MED ORDER — CHERATUSSIN AC 100-10 MG/5ML PO SOLN: 10.0000 mL | Freq: Three times a day (TID) | ORAL | 0 refills | Status: DC | PRN

## 2022-12-06 MED ORDER — PREDNISONE 20 MG PO TABS: 40.0000 mg | ORAL_TABLET | Freq: Every day | ORAL | 0 refills | Status: AC

## 2022-12-06 NOTE — ED Provider Notes (Signed)
[facility]    CSN: [id number] Arrival date & time: 12/06/22  2751      History   Chief Complaint Chief Complaint  Patient presents with   Cough    HPI Claire Olsen is a 42 y.o. female presenting for 4 to 5-day history of productive cough.  She reports she gets into coughing fits and feels like she cannot breathe.  She says it causes her to feel like she is short of breath.  She has not had any fevers but says she does not run fevers when she gets sick.  She has also had some nasal congestion and mild sore throat.  Reports taking a COVID test and says it was negative.  She denies any sick contacts.  Has been taking over-the-counter cough medicine but says it has not really helped.  No history of cardiopulmonary disease.  Does have a history of obesity, PCOS and hypertension.  HPI  Past Medical History:  Diagnosis Date   BRCA negative 04/2016   MyRisk neg   Dysplastic nevus 07/05/2008   Mid lower back - mild    Dysplastic nevus 01/25/2020   R upper back sup med scapula - moderate   Dysplastic nevus 06/11/2021   right lat neck at hairline - severe - margins clear 08/12/21   Dysplastic nevus 06/11/2021   right prox ant thigh near groin - moderate   Family history of ovarian cancer    mother and mat aunt   Hypertension    Menorrhagia    PCOS (polycystic ovarian syndrome)    Spina bifida without hydrocephalus [facility])     Patient Active Problem List   Diagnosis Date Noted   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 ribbon clip/ path pending   BREAST BIOPSY Left 10/19/2022   [place] LT BREAST BX W LOC DEV 1ST LESION IMG BX SPEC US GUIDE 10/19/2022 [facility]   CESAREAN SECTION  2009/2012   HYSTEROSCOPY WITH D & C  01/20/2013   Severe menorrhagia    ([facility])   TUBAL LIGATION      OB History     Gravida  2   Para  2   Term  2   Preterm      AB  0   Living  2      SAB      IAB      Ectopic      Multiple      Live Births  2            Home Medications    Prior to Admission medications   Medication Sig Start Date End Date Taking? Authorizing Provider  guaiFENesin-codeine (CHERATUSSIN AC) 100-10 MG/5ML syrup Take 10 mLs by mouth 3 (three) times daily as needed. 12/06/22  Yes Danton Clap, PA-C  ketoconazole (NIZORAL) 2 % shampoo Shampoo into scalp let sit 5 minutes then wash out. Use 3d/wk. 06/17/22  Yes Ralene Bathe, MD  levonorgestrel (MIRENA) 20 MCG/24HR IUD 1 each by Intrauterine route once.   Yes [provider]  Multiple Vitamin (MULTI-VITAMINS) TABS Take by mouth. 10/17/10  Yes [provider]  olmesartan-hydrochlorothiazide (BENICAR HCT) 40-12.5 MG tablet Take 1 tablet by mouth daily. 06/10/20  Yes [provider]  predniSONE (DELTASONE) 20 MG tablet Take 2  tablets (40 mg total) by mouth daily for 5 days. 12/06/22 12/11/22 Yes Laurene Footman B, PA-C  rosuvastatin (CRESTOR) 5 MG tablet Take 5 mg by mouth daily. 09/13/20  Yes [provider]  triamterene-hydrochlorothiazide (DYAZIDE) 37.5-25 MG capsule Take by mouth. 12/17/20 12/17/21  [provider]    Family History Family History  Problem Relation Age of Onset   Cervical cancer Mother    Ovarian cancer Mother 37   Hyperlipidemia Mother    Stroke Mother    Hypertension Father    Ovarian cancer Maternal Aunt        late 20's   Hyperlipidemia Maternal Grandfather    Hypertension Paternal Grandmother    Melanoma Paternal Grandfather 85   Prostate cancer Paternal Grandfather    Breast cancer Neg Hx     Social History Social History   Tobacco Use   Smoking status: Never   Smokeless tobacco: Never  Vaping Use   Vaping Use: Never used  Substance Use Topics   Alcohol use: Yes    Comment:  infrequent   Drug use: No     Allergies   Sulfa antibiotics   Review of Systems Review of Systems  Constitutional:  Positive for fatigue. Negative for chills, diaphoresis and fever.  HENT:  Positive for congestion, rhinorrhea and sore throat. Negative for ear pain, sinus pressure and sinus pain.   Respiratory:  Positive for cough and shortness of breath.   Gastrointestinal:  Negative for abdominal pain, nausea and vomiting.  Musculoskeletal:  Negative for arthralgias and myalgias.  Skin:  Negative for rash.  Neurological:  Negative for weakness and headaches.  Hematological:  Negative for adenopathy.  Psychiatric/Behavioral:  Positive for sleep disturbance.      Physical Exam Triage Vital Signs ED Triage Vitals  Enc Vitals Group     BP      Pulse      Resp      Temp      Temp src      SpO2      Weight      Height      Head Circumference      Peak Flow      Pain Score      Pain Loc      Pain Edu?      Excl. in [place]?    No data found.  Updated Vital Signs BP 115/71 (BP Location: Left Arm)   Pulse 93   Temp 97.9 F (36.6 C) (Oral)   Resp 18   Ht '5\' 10"'$  (1.778 m)   Wt 268 lb (121.6 kg)   SpO2 95%   BMI 38.45 kg/m    Physical Exam Vitals and nursing note reviewed.  Constitutional:      General: She is not in acute distress.    Appearance: Normal appearance. She is not ill-appearing or toxic-appearing.  HENT:     Head: Normocephalic and atraumatic.     Nose: Congestion present.     Mouth/Throat:     Mouth: Mucous membranes are moist.     Pharynx: Oropharynx is clear.  Eyes:     General: No scleral icterus.       Right eye: No discharge.        Left eye: No discharge.     Conjunctiva/sclera: Conjunctivae normal.  Cardiovascular:     Rate and Rhythm: Normal rate and regular rhythm.     Heart sounds: Normal heart sounds.  Pulmonary:     Effort: Pulmonary effort  is normal. No respiratory distress.     Breath sounds: Rhonchi (few scattered rhonchi  bilateral upper airway) present.  Musculoskeletal:     Cervical back: Neck supple.  Skin:    General: Skin is dry.  Neurological:     General: No focal deficit present.     Mental Status: She is alert. Mental status is at baseline.     Motor: No weakness.     Gait: Gait normal.  Psychiatric:        Mood and Affect: Mood normal.        Behavior: Behavior normal.        Thought Content: Thought content normal.      UC Treatments / Results  Labs (all labs ordered are listed, but only abnormal results are displayed) Labs Reviewed - No data to display  EKG   Radiology No results found.  Procedures Procedures (including critical care time)  Medications Ordered in UC Medications - No data to display  Initial Impression / Assessment and Plan / UC Course  I have reviewed the triage vital signs and the nursing notes.  Pertinent labs & imaging results that were available during my care of the patient were reviewed by me and considered in my medical decision making (see chart for details).   42 year old female presents for 4 to 5-day history of productive cough, congestion, coughing fits that lead to shortness of breath, runny nose and sore throat.  Negative COVID test.  No fever.  Vitals normal and stable.  She is overall well-appearing.  Coughs frequently throughout exam.  On exam she has mild nasal congestion.  Throat is clear.  Few scattered rhonchi throughout upper airway.  No distress.  Advised patient she has viral bronchitis.  Supportive care encouraged.  Sent Cheratussin to pharmacy as well as prednisone.  Reviewed that she may be sick for couple weeks.  Advised her to return if fever, worsening cough or increased shortness of breath.   Final Clinical Impressions(s) / UC Diagnoses   Final diagnoses:  Acute bronchitis, unspecified organism  Acute cough     Discharge Instructions      -You have bronchitis and asthma discussed that can last for couple weeks.  I  sent cough medication and prednisone.  Increase rest and fluids. - If you have any fever or breathing difficulty, return.     ED Prescriptions     Medication Sig Dispense Auth. Provider   guaiFENesin-codeine (CHERATUSSIN AC) 100-10 MG/5ML syrup Take 10 mLs by mouth 3 (three) times daily as needed. 120 mL Laurene Footman B, PA-C   predniSONE (DELTASONE) 20 MG tablet Take 2 tablets (40 mg total) by mouth daily for 5 days. 10 tablet Danton Clap, PA-C      I have reviewed the PDMP during this encounter.   Danton Clap, PA-C 12/06/22 1008

## 2022-12-06 NOTE — Discharge Instructions (Addendum)
-  You have bronchitis and asthma discussed that can last for couple weeks.  I sent cough medication and prednisone.  Increase rest and fluids. - If you have any fever or breathing difficulty, return.

## 2022-12-06 NOTE — ED Triage Notes (Signed)
Pt c/o productive cough, nasal drainage, chest congestion, SOB x5days  Pt tested for covid and it was negative.

## 2022-12-10 ENCOUNTER — Encounter: Payer: Self-pay | Admitting: Obstetrics and Gynecology

## 2022-12-15 ENCOUNTER — Other Ambulatory Visit: Payer: Self-pay | Admitting: General Surgery

## 2022-12-15 DIAGNOSIS — N6320 Unspecified lump in the left breast, unspecified quadrant: Secondary | ICD-10-CM

## 2023-01-05 ENCOUNTER — Other Ambulatory Visit: Payer: Self-pay

## 2023-01-05 ENCOUNTER — Encounter (HOSPITAL_BASED_OUTPATIENT_CLINIC_OR_DEPARTMENT_OTHER): Payer: Self-pay | Admitting: General Surgery

## 2023-01-07 NOTE — H&P (Signed)
42 year old female who has no prior breast history.  She has a family history of ovarian cancer in her mom that sounds like it was in her 77s.  She underwent genetic testing but is not sure what type.  This was negative.  We will obtain that report.  She has some left breast pain and discomfort.  She does not have any discharge and does not note a mass.  She had been followed by mammography.  She had mammogram done previously that showed an area of density.  This previously was about 3.9 cm but on the latest mammogram in November 2023 this asymmetry in the outer posterior left breast is increased to 5.7 cm.  An ultrasound shows a mixed area measuring 3.9 x 1 x 3 cm in size.  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  The pathology results are deemed concordant with the imaging findings.  She is here to discuss her options.   Review of Systems: A complete review of systems was obtained from the patient.  I have reviewed this information and discussed as appropriate with the patient.  See HPI as well for other ROS.   Review of Systems  HENT:  Positive for congestion.   All other systems reviewed and are negative.     Medical History: Past Medical History      Past Medical History:  Diagnosis Date   Benign essential hypertension 10/26/2019   Hirsutism     History of chicken pox     History of scarlet fever     Hypertension in pregnancy     Obesity     PCOS (polycystic ovarian syndrome) 03/10/2017   Spina bifida occulta             Patient Active Problem List  Diagnosis   PCOS (polycystic ovarian syndrome)   Cervical disc disease   Benign essential hypertension   Family history of ASCVD (arteriosclerotic cardiovascular disease)   Hyperlipidemia, mixed   Melanoma in situ of back (CMS-HCC)      Past Surgical History       Past Surgical History:  Procedure Laterality Date   DILATION & CURRETTAGE   2010   CESAREAN SECTION   02/07/08   10/13/11   pre skin cancer        right  side of neck at hairline and right upper thigh by Dr. Nehemiah Massed 2022   TUBAL LIGATION            Allergies       Allergies  Allergen Reactions   Adhesive Other (See Comments)      Gets blisters from the powder from latex tape   Sulfa (Sulfonamide Antibiotics) Rash              Current Outpatient Medications on File Prior to Visit  Medication Sig Dispense Refill   ketoconazole (NIZORAL) 2 % shampoo         levonorgestrel (MIRENA) 20 mcg/24 hr (5 years) IUD Insert 1 each into the uterus once. Follow package directions.       olmesartan-hydroCHLOROthiazide (BENICAR HCT) 40-12.5 mg tablet Take 1 tablet by mouth once daily 90 tablet 1   rosuvastatin (CRESTOR) 5 MG tablet Take 1 tablet (5 mg total) by mouth once daily 90 tablet 3   triamterene-hydrochlorothiazide (DYAZIDE) 37.5-25 mg capsule Take 1 capsule by mouth once daily as needed (edema) 90 capsule 3    No current facility-administered medications on file prior to visit.      Family  History       Family History  Problem Relation Age of Onset   High blood pressure (Hypertension) Father     High blood pressure (Hypertension) Brother     Dwarfism Brother     High blood pressure (Hypertension) Maternal Grandmother     High blood pressure (Hypertension) Maternal Grandfather     Stroke Maternal Grandfather     High blood pressure (Hypertension) Paternal Grandmother     Diabetes type II Paternal Grandmother     Skin cancer Paternal Grandmother     High blood pressure (Hypertension) Paternal Grandfather     Stroke Mother     Atrial fibrillation (Abnormal heart rhythm sometimes requiring treatment with blood thinners) Mother     High blood pressure (Hypertension) Mother     Hyperlipidemia (Elevated cholesterol) Mother     High blood pressure (Hypertension) Brother     Abnormal EKG Brother          Social History       Tobacco Use  Smoking Status Never  Smokeless Tobacco Never      Social History  Social History          Socioeconomic History   Marital status: Married  Occupational History   Occupation: Principal   Occupation: Film/video editor  Tobacco Use   Smoking status: Never   Smokeless tobacco: Never  Vaping Use   Vaping Use: Never used  Substance and Sexual Activity   Alcohol use: No   Drug use: No   Sexual activity: Yes      Partners: Male      Birth control/protection: Surgical, I.U.D.  Social History Narrative    Former Health and safety inspector.        Objective:        Vitals:    12/09/22 0903 12/09/22 0904  BP: 117/78    Pulse: 79    Temp: 36.5 C (97.7 F)    SpO2: 97%    Weight: (!) 131.5 kg (290 lb)    Height: 177.8 cm (5' 10"$ )    PainSc:   0-No pain    Body mass index is 41.61 kg/m.   Physical Exam Vitals reviewed.  Constitutional:      Appearance: Normal appearance.  Chest:  Breasts:    Right: No inverted nipple, mass or nipple discharge.     Left: No inverted nipple, mass or nipple discharge.  Lymphadenopathy:     Upper Body:     Right upper body: No supraclavicular or axillary adenopathy.     Left upper body: No supraclavicular or axillary adenopathy.  Neurological:     Mental Status: She is alert.          Assessment and Plan:     Diagnoses and all orders for this visit:   Mass of upper outer quadrant of left breast   Left breast radioactive seed guided excisional biopsy   We discussed that I do think it be important to get her genetic testing results.  If these are not a panel test I would plan on repeating that.  We discussed the options for the past.  I think certainly we could just follow this area at this point.  The other option would be to do a seed guided excisional biopsy.  Due to her concern for this she would like to proceed with a seed guided excisional biopsy.  We discussed the surgery as well as risks and recovery and we will plan to proceed.

## 2023-01-08 ENCOUNTER — Ambulatory Visit
Admission: RE | Admit: 2023-01-08 | Discharge: 2023-01-08 | Disposition: A | Discharge status: Home / Self Care | Payer: BC Managed Care – PPO | Source: Ambulatory Visit | Attending: General Surgery | Admitting: General Surgery

## 2023-01-08 DIAGNOSIS — N6320 Unspecified lump in the left breast, unspecified quadrant: Secondary | ICD-10-CM

## 2023-01-08 HISTORY — PX: BREAST BIOPSY: SHX20

## 2023-01-08 NOTE — Progress Notes (Signed)

## 2023-01-11 ENCOUNTER — Other Ambulatory Visit: Payer: Self-pay

## 2023-01-11 ENCOUNTER — Ambulatory Visit (HOSPITAL_BASED_OUTPATIENT_CLINIC_OR_DEPARTMENT_OTHER): Payer: BC Managed Care – PPO | Admitting: Anesthesiology

## 2023-01-11 ENCOUNTER — Encounter (HOSPITAL_BASED_OUTPATIENT_CLINIC_OR_DEPARTMENT_OTHER)
Admission: RE | Disposition: A | Discharge status: Home / Self Care | Payer: Self-pay | Source: Home / Self Care | Attending: General Surgery

## 2023-01-11 ENCOUNTER — Ambulatory Visit
Admission: RE | Admit: 2023-01-11 | Discharge: 2023-01-11 | Disposition: A | Discharge status: Home / Self Care | Payer: BC Managed Care – PPO | Source: Ambulatory Visit | Attending: General Surgery | Admitting: General Surgery

## 2023-01-11 ENCOUNTER — Ambulatory Visit (HOSPITAL_BASED_OUTPATIENT_CLINIC_OR_DEPARTMENT_OTHER)
Admission: RE | Admit: 2023-01-11 | Discharge: 2023-01-11 | Disposition: A | Discharge status: Home / Self Care | Payer: BC Managed Care – PPO | Attending: General Surgery | Admitting: General Surgery

## 2023-01-11 ENCOUNTER — Encounter (HOSPITAL_BASED_OUTPATIENT_CLINIC_OR_DEPARTMENT_OTHER): Payer: Self-pay | Admitting: General Surgery

## 2023-01-11 DIAGNOSIS — R921 Mammographic calcification found on diagnostic imaging of breast: Secondary | ICD-10-CM | POA: Diagnosis not present

## 2023-01-11 DIAGNOSIS — Z6841 Body Mass Index (BMI) 40.0 and over, adult: Secondary | ICD-10-CM | POA: Diagnosis not present

## 2023-01-11 DIAGNOSIS — N644 Mastodynia: Secondary | ICD-10-CM | POA: Diagnosis not present

## 2023-01-11 DIAGNOSIS — I10 Essential (primary) hypertension: Secondary | ICD-10-CM | POA: Diagnosis not present

## 2023-01-11 DIAGNOSIS — Z8041 Family history of malignant neoplasm of ovary: Secondary | ICD-10-CM | POA: Diagnosis not present

## 2023-01-11 DIAGNOSIS — N6320 Unspecified lump in the left breast, unspecified quadrant: Secondary | ICD-10-CM

## 2023-01-11 DIAGNOSIS — Z01818 Encounter for other preprocedural examination: Secondary | ICD-10-CM

## 2023-01-11 DIAGNOSIS — N6489 Other specified disorders of breast: Secondary | ICD-10-CM | POA: Insufficient documentation

## 2023-01-11 HISTORY — PX: BREAST EXCISIONAL BIOPSY: SUR124

## 2023-01-11 HISTORY — PX: RADIOACTIVE SEED GUIDED EXCISIONAL BREAST BIOPSY: SHX6490

## 2023-01-11 LAB — POCT PREGNANCY, URINE: Preg Test, Ur: NEGATIVE

## 2023-01-11 SURGERY — RADIOACTIVE SEED GUIDED BREAST BIOPSY
Anesthesia: General | Site: Breast | Laterality: Left

## 2023-01-11 MED ORDER — FENTANYL CITRATE (PF) 100 MCG/2ML IJ SOLN
INTRAMUSCULAR | Status: DC | PRN
  Administered 2023-01-11 (×2): 50 ug via INTRAVENOUS

## 2023-01-11 MED ORDER — LIDOCAINE 2% (20 MG/ML) 5 ML SYRINGE
INTRAMUSCULAR | Status: AC
  Filled 2023-01-11: qty 5

## 2023-01-11 MED ORDER — ONDANSETRON HCL 4 MG/2ML IJ SOLN
INTRAMUSCULAR | Status: AC
  Filled 2023-01-11: qty 2

## 2023-01-11 MED ORDER — FENTANYL CITRATE (PF) 100 MCG/2ML IJ SOLN
INTRAMUSCULAR | Status: AC
  Filled 2023-01-11: qty 2

## 2023-01-11 MED ORDER — ACETAMINOPHEN 500 MG PO TABS
1000.0000 mg | ORAL_TABLET | ORAL | Status: AC
  Administered 2023-01-11: 1000 mg via ORAL

## 2023-01-11 MED ORDER — HYDROMORPHONE HCL 1 MG/ML IJ SOLN
INTRAMUSCULAR | Status: AC
  Filled 2023-01-11: qty 0.5

## 2023-01-11 MED ORDER — CEFAZOLIN SODIUM-DEXTROSE 2-4 GM/100ML-% IV SOLN
INTRAVENOUS | Status: AC
  Filled 2023-01-11: qty 100

## 2023-01-11 MED ORDER — MIDAZOLAM HCL 5 MG/5ML IJ SOLN
INTRAMUSCULAR | Status: DC | PRN
  Administered 2023-01-11: 2 mg via INTRAVENOUS

## 2023-01-11 MED ORDER — CHLORHEXIDINE GLUCONATE CLOTH 2 % EX PADS: 6.0000 | MEDICATED_PAD | Freq: Once | CUTANEOUS | Status: DC

## 2023-01-11 MED ORDER — DEXAMETHASONE SODIUM PHOSPHATE 10 MG/ML IJ SOLN
INTRAMUSCULAR | Status: AC
  Filled 2023-01-11: qty 1

## 2023-01-11 MED ORDER — ONDANSETRON HCL 4 MG/2ML IJ SOLN
INTRAMUSCULAR | Status: DC | PRN
  Administered 2023-01-11: 4 mg via INTRAVENOUS

## 2023-01-11 MED ORDER — MIDAZOLAM HCL 2 MG/2ML IJ SOLN
INTRAMUSCULAR | Status: AC
  Filled 2023-01-11: qty 2

## 2023-01-11 MED ORDER — PROPOFOL 500 MG/50ML IV EMUL
INTRAVENOUS | Status: DC | PRN
  Administered 2023-01-11: 150 ug/kg/min via INTRAVENOUS

## 2023-01-11 MED ORDER — CEFAZOLIN SODIUM-DEXTROSE 1-4 GM/50ML-% IV SOLN
INTRAVENOUS | Status: AC
  Filled 2023-01-11: qty 50

## 2023-01-11 MED ORDER — TRAMADOL HCL 50 MG PO TABS: 50.0000 mg | ORAL_TABLET | Freq: Four times a day (QID) | ORAL | 0 refills | Status: AC | PRN

## 2023-01-11 MED ORDER — ENSURE PRE-SURGERY PO LIQD: 296.0000 mL | Freq: Once | ORAL | Status: DC

## 2023-01-11 MED ORDER — PROMETHAZINE HCL 25 MG/ML IJ SOLN: 6.2500 mg | INTRAMUSCULAR | Status: DC | PRN

## 2023-01-11 MED ORDER — OXYCODONE HCL 5 MG/5ML PO SOLN: 5.0000 mg | Freq: Once | ORAL | Status: AC | PRN

## 2023-01-11 MED ORDER — OXYCODONE HCL 5 MG PO TABS
ORAL_TABLET | ORAL | Status: AC
  Filled 2023-01-11: qty 1

## 2023-01-11 MED ORDER — LACTATED RINGERS IV SOLN: INTRAVENOUS | Status: DC

## 2023-01-11 MED ORDER — LIDOCAINE HCL (CARDIAC) PF 100 MG/5ML IV SOSY
PREFILLED_SYRINGE | INTRAVENOUS | Status: DC | PRN
  Administered 2023-01-11: 60 mg via INTRATRACHEAL

## 2023-01-11 MED ORDER — OXYCODONE HCL 5 MG PO TABS
5.0000 mg | ORAL_TABLET | Freq: Once | ORAL | Status: AC | PRN
  Administered 2023-01-11: 5 mg via ORAL

## 2023-01-11 MED ORDER — PROPOFOL 10 MG/ML IV BOLUS
INTRAVENOUS | Status: AC
  Filled 2023-01-11: qty 20

## 2023-01-11 MED ORDER — ACETAMINOPHEN 500 MG PO TABS
ORAL_TABLET | ORAL | Status: AC
  Filled 2023-01-11: qty 2

## 2023-01-11 MED ORDER — HYDROMORPHONE HCL 1 MG/ML IJ SOLN
0.2500 mg | INTRAMUSCULAR | Status: DC | PRN
  Administered 2023-01-11 (×2): 0.25 mg via INTRAVENOUS
  Administered 2023-01-11: 0.5 mg via INTRAVENOUS

## 2023-01-11 MED ORDER — DEXAMETHASONE SODIUM PHOSPHATE 10 MG/ML IJ SOLN
INTRAMUSCULAR | Status: DC | PRN
  Administered 2023-01-11: 10 mg via INTRAVENOUS

## 2023-01-11 MED ORDER — BUPIVACAINE HCL (PF) 0.25 % IJ SOLN
INTRAMUSCULAR | Status: DC | PRN
  Administered 2023-01-11: 10 mL

## 2023-01-11 MED ORDER — CEFAZOLIN IN SODIUM CHLORIDE 3-0.9 GM/100ML-% IV SOLN
3.0000 g | INTRAVENOUS | Status: AC
  Administered 2023-01-11: 3 g via INTRAVENOUS

## 2023-01-11 MED ORDER — MEPERIDINE HCL 25 MG/ML IJ SOLN: 6.2500 mg | INTRAMUSCULAR | Status: DC | PRN

## 2023-01-11 MED ORDER — PROPOFOL 10 MG/ML IV BOLUS
INTRAVENOUS | Status: DC | PRN
  Administered 2023-01-11: 200 mg via INTRAVENOUS

## 2023-01-11 MED ORDER — AMISULPRIDE (ANTIEMETIC) 5 MG/2ML IV SOLN: 10.0000 mg | Freq: Once | INTRAVENOUS | Status: DC | PRN

## 2023-01-11 SURGICAL SUPPLY — 62 items
ADH SKN CLS APL DERMABOND .7 (GAUZE/BANDAGES/DRESSINGS) ×1
APL PRP STRL LF DISP 70% ISPRP (MISCELLANEOUS) ×1
APPLIER CLIP 9.375 MED OPEN (MISCELLANEOUS)
APR CLP MED 9.3 20 MLT OPN (MISCELLANEOUS)
BINDER BREAST 3XL (GAUZE/BANDAGES/DRESSINGS) IMPLANT
BINDER BREAST LRG (GAUZE/BANDAGES/DRESSINGS) IMPLANT
BINDER BREAST MEDIUM (GAUZE/BANDAGES/DRESSINGS) IMPLANT
BINDER BREAST XLRG (GAUZE/BANDAGES/DRESSINGS) IMPLANT
BINDER BREAST XXLRG (GAUZE/BANDAGES/DRESSINGS) IMPLANT
BLADE SURG 15 STRL LF DISP TIS (BLADE) ×2 IMPLANT
BLADE SURG 15 STRL SS (BLADE) ×1
CANISTER SUC SOCK COL 7IN (MISCELLANEOUS) IMPLANT
CANISTER SUCT 1200ML W/VALVE (MISCELLANEOUS) IMPLANT
CHLORAPREP W/TINT 26 (MISCELLANEOUS) ×2 IMPLANT
CLIP APPLIE 9.375 MED OPEN (MISCELLANEOUS) IMPLANT
CLIP TI WIDE RED SMALL 6 (CLIP) IMPLANT
COVER BACK TABLE 60X90IN (DRAPES) ×2 IMPLANT
COVER MAYO STAND STRL (DRAPES) ×2 IMPLANT
COVER PROBE CYLINDRICAL 5X96 (MISCELLANEOUS) ×2 IMPLANT
DERMABOND ADVANCED .7 DNX12 (GAUZE/BANDAGES/DRESSINGS) ×2 IMPLANT
DRAPE LAPAROSCOPIC ABDOMINAL (DRAPES) ×2 IMPLANT
DRAPE UTILITY XL STRL (DRAPES) ×2 IMPLANT
DRSG TEGADERM 4X4.75 (GAUZE/BANDAGES/DRESSINGS) IMPLANT
ELECT COATED BLADE 2.86 ST (ELECTRODE) ×2 IMPLANT
ELECT REM PT RETURN 9FT ADLT (ELECTROSURGICAL) ×1
ELECTRODE REM PT RTRN 9FT ADLT (ELECTROSURGICAL) ×2 IMPLANT
GAUZE SPONGE 4X4 12PLY STRL LF (GAUZE/BANDAGES/DRESSINGS) IMPLANT
GLOVE BIO SURGEON STRL SZ7 (GLOVE) ×4 IMPLANT
GLOVE BIOGEL PI IND STRL 7.0 (GLOVE) IMPLANT
GLOVE BIOGEL PI IND STRL 7.5 (GLOVE) ×2 IMPLANT
GLOVE SURG SS PI 7.0 STRL IVOR (GLOVE) IMPLANT
GLOVE SURG SYN 7.5  E (GLOVE) ×1
GLOVE SURG SYN 7.5 E (GLOVE) ×1 IMPLANT
GLOVE SURG SYN 7.5 PF PI (GLOVE) IMPLANT
GOWN STRL REUS W/ TWL LRG LVL3 (GOWN DISPOSABLE) ×4 IMPLANT
GOWN STRL REUS W/ TWL XL LVL3 (GOWN DISPOSABLE) IMPLANT
GOWN STRL REUS W/TWL LRG LVL3 (GOWN DISPOSABLE) ×1
GOWN STRL REUS W/TWL XL LVL3 (GOWN DISPOSABLE) ×1
HEMOSTAT ARISTA ABSORB 3G PWDR (HEMOSTASIS) IMPLANT
KIT MARKER MARGIN INK (KITS) ×2 IMPLANT
NDL HYPO 25X1 1.5 SAFETY (NEEDLE) ×2 IMPLANT
NEEDLE HYPO 25X1 1.5 SAFETY (NEEDLE) ×1 IMPLANT
NS IRRIG 1000ML POUR BTL (IV SOLUTION) IMPLANT
PACK BASIN DAY SURGERY FS (CUSTOM PROCEDURE TRAY) ×2 IMPLANT
PENCIL SMOKE EVACUATOR (MISCELLANEOUS) ×2 IMPLANT
RETRACTOR ONETRAX LX 90X20 (MISCELLANEOUS) IMPLANT
SLEEVE SCD COMPRESS KNEE MED (STOCKING) ×2 IMPLANT
SPIKE FLUID TRANSFER (MISCELLANEOUS) IMPLANT
SPONGE T-LAP 4X18 ~~LOC~~+RFID (SPONGE) ×2 IMPLANT
STRIP CLOSURE SKIN 1/2X4 (GAUZE/BANDAGES/DRESSINGS) ×2 IMPLANT
SUT MNCRL AB 4-0 PS2 18 (SUTURE) IMPLANT
SUT MON AB 5-0 PS2 18 (SUTURE) IMPLANT
SUT SILK 2 0 SH (SUTURE) IMPLANT
SUT VIC AB 2-0 SH 27 (SUTURE) ×1
SUT VIC AB 2-0 SH 27XBRD (SUTURE) ×2 IMPLANT
SUT VIC AB 3-0 SH 27 (SUTURE) ×1
SUT VIC AB 3-0 SH 27X BRD (SUTURE) ×2 IMPLANT
SYR CONTROL 10ML LL (SYRINGE) ×2 IMPLANT
TOWEL GREEN STERILE FF (TOWEL DISPOSABLE) ×2 IMPLANT
TRAY FAXITRON CT DISP (TRAY / TRAY PROCEDURE) ×2 IMPLANT
TUBE CONNECTING 20X1/4 (TUBING) IMPLANT
YANKAUER SUCT BULB TIP NO VENT (SUCTIONS) IMPLANT

## 2023-01-11 NOTE — H&P (Signed)
42 year old female who has no prior breast history. She has a family history of ovarian cancer in her mom that sounds like it was in her 24s. She underwent genetic testing but is not sure what type. This was negative. We will obtain that report. She has some left breast pain and discomfort. She does not have any discharge and does not note a mass. She had been followed by mammography. She had mammogram done previously that showed an area of density. This previously was about 3.9 cm but on the latest mammogram in November 2023 this asymmetry in the outer posterior left breast is increased to 5.7 cm. An ultrasound shows a mixed area measuring 3.9 x 1 x 3 cm in size. This is undergone a biopsy that shows [place]. The pathology results are deemed concordant with the imaging findings. She is here to discuss her options.  Review of Systems: A complete review of systems was obtained from the patient. I have reviewed this information and discussed as appropriate with the patient. See HPI as well for other ROS.  Review of Systems HENT: Positive for congestion. All other systems reviewed and are negative.   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 Nehemiah Massed 2022 TUBAL LIGATION   Allergies Allergen Reactions Adhesive Other (See Comments) Gets  blisters from the powder from latex tape Sulfa (Sulfonamide Antibiotics) Rash  Current Outpatient Medications on File Prior to Visit Medication Sig Dispense Refill ketoconazole (NIZORAL) 2 % shampoo levonorgestrel (MIRENA) 20 mcg/24 hr (5 years) IUD Insert 1 each into the uterus once. Follow package directions. olmesartan-hydroCHLOROthiazide (BENICAR HCT) 40-12.5 mg tablet Take 1 tablet by mouth once daily 90 tablet 1 rosuvastatin (CRESTOR) 5 MG tablet Take 1 tablet (5 mg total) by mouth once daily 90 tablet 3 triamterene-hydrochlorothiazide (DYAZIDE) 37.5-25 mg capsule Take 1 capsule by mouth once daily as needed (edema) 90 capsule 3   Family History Problem Relation Age of Onset High blood pressure (Hypertension) Father High blood pressure (Hypertension) Brother Dwarfism Brother High blood pressure (Hypertension) Maternal Grandmother High blood pressure (Hypertension) Maternal Grandfather Stroke Maternal Grandfather High blood pressure (Hypertension) Paternal Grandmother Diabetes type II Paternal Grandmother Skin cancer Paternal Grandmother High blood pressure (Hypertension) Paternal Grandfather Stroke Mother Atrial fibrillation (Abnormal heart rhythm sometimes requiring treatment with blood thinners) Mother High blood pressure (Hypertension) Mother Hyperlipidemia (Elevated cholesterol) Mother High blood pressure (Hypertension) Brother Abnormal EKG Brother   Social History  Tobacco Use Smoking Status Never Smokeless Tobacco Never Marital status: Married Occupational History Occupation: Principal Occupation: Film/video editor Tobacco Use Smoking status: Never Smokeless tobacco: Never Vaping Use Vaping Use: Never used Substance and Sexual Activity Alcohol use: No Drug use: No Sexual activity: Yes Partners: Male Birth control/protection: Surgical, I.U.D. Social History Narrative Former Health and safety inspector.  Objective:  Vitals: Body mass index is 41.61  kg/m.  Physical Exam Vitals reviewed. Constitutional: Appearance: Normal appearance. Chest: Breasts: Right: No inverted nipple, mass or nipple discharge. Left: No inverted nipple, mass or nipple discharge. Lymphadenopathy: Upper Body: Right upper body:  No supraclavicular or axillary adenopathy. Left upper body: No supraclavicular or axillary adenopathy. Neurological: Mental Status: She is alert.   Assessment and Plan:  Mass of upper outer quadrant of left breast  Left breast radioactive seed guided excisional biopsy  We discussed that I do think it be important to get her genetic testing results. If these are not a panel test I would plan on repeating that. We discussed the options for the past. I think certainly we could just follow this area at this point. The other option would be to do a seed guided excisional biopsy. Due to her concern for this she would like to proceed with a seed guided excisional biopsy. We discussed the surgery as well as risks and recovery and we will plan to proceed.

## 2023-01-11 NOTE — Op Note (Addendum)
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 Serita Grammes Anesthesia: General Specimens: Left breast tissue marked with paint Estimated blood loss: Minimal Complications: None Drains: None Sponge needle count was correct completion Disposition recovery stable addition  Indications:42 year old female who has no prior breast history. She has a family history of ovarian cancer in her mom that sounds like it was in her 62s.  She has some left breast pain and discomfort. She does not have any discharge and does not note a mass. She had been followed by mammography. She had mammogram done previously that showed an area of density. This previously was about 3.9 cm but on the latest mammogram in November 2023 this asymmetry in the outer posterior left breast is increased to 5.7 cm. An ultrasound shows a mixed area measuring 3.9 x 1 x 3 cm in size. This is undergone a biopsy that shows [place]. The pathology results are deemed concordant with the imaging findings.  She wanted to proceed with excision so we discussed the seed guided excisional biopsy.  Procedure: After informed consent was obtained she was taken to the operating.  She had a radioactive seed placed prior to this and had these mammograms in the operating room.  She was given antibiotics.  SCDs were placed.  She was placed under general anesthesia without complication.  She was prepped and draped in sterile sterile surgical fashion.  Surgical timeout was then performed.  The area was in the lower outer quadrant.  I infiltrated Marcaine throughout this area.  I then made a curvilinear incision in the very lateral breast near the IM fold.  I then dissected to the seed.  There is she was a whitish appearing mass at the site.  I remove the seed the surrounding tissue in the  area that appeared to be a mass.  This was then marked with paint.  I then did a mammogram confirmed removal of the seed and the clip.  Hemostasis was obtained.  I then closed this with 2-0 Vicryl, 3-0 Vicryl, and 4-0 Monocryl.  Glue Steri-Strips applied.  She tolerated this well was extubated transferred to recovery stable.

## 2023-01-11 NOTE — Transfer of Care (Signed)
Immediate Anesthesia Transfer of Care Note  Patient: Claire Olsen  Procedure(s) Performed: RADIOACTIVE SEED GUIDED EXCISIONAL LEFT BREAST BIOPSY (Left: Breast)  Patient Location: PACU  Anesthesia Type:General  Level of Consciousness: drowsy, patient cooperative, and responds to stimulation  Airway & Oxygen Therapy: Patient Spontanous Breathing and Patient connected to face mask oxygen  Post-op Assessment: Report given to RN and Post -op Vital signs reviewed and stable  Post vital signs: Reviewed and stable  Last Vitals:  Vitals Value Taken Time  BP    Temp    Pulse 96 01/11/23 1106  Resp 29 01/11/23 1106  SpO2 92 % 01/11/23 1106  Vitals shown include unvalidated device data.  Last Pain:  Vitals:   01/11/23 0823  TempSrc: Oral  PainSc: 0-No pain      Patients Stated Pain Goal: 8 ([id number])  Complications: No notable events documented.

## 2023-01-11 NOTE — Anesthesia Postprocedure Evaluation (Signed)
Anesthesia Post Note  Patient: Claire Olsen  Procedure(s) Performed: RADIOACTIVE SEED GUIDED EXCISIONAL LEFT BREAST BIOPSY (Left: Breast)     Patient location during evaluation: PACU Anesthesia Type: General Level of consciousness: awake and alert Pain management: pain level controlled Vital Signs Assessment: post-procedure vital signs reviewed and stable Respiratory status: spontaneous breathing, nonlabored ventilation and respiratory function stable Cardiovascular status: blood pressure returned to baseline and stable Postop Assessment: no apparent nausea or vomiting Anesthetic complications: no   No notable events documented.  Last Vitals:  Vitals:   01/11/23 1200 01/11/23 1215  BP: 102/69 96/61  Pulse: (!) 56 (!) 54  Resp: 19 15  Temp:    SpO2: 91% 91%    Last Pain:  Vitals:   01/11/23 1200  TempSrc:   PainSc: [place]

## 2023-01-11 NOTE — Interval H&P Note (Signed)
History and Physical Interval Note:  01/11/2023 8:04 AM  Claire Olsen  has presented today for surgery, with the diagnosis of LEFT BREAST MASS.  The various methods of treatment have been discussed with the patient and family. After consideration of risks, benefits and other options for treatment, the patient has consented to  Procedure(s): RADIOACTIVE SEED GUIDED EXCISIONAL LEFT BREAST BIOPSY (Left) as a surgical intervention.  The patient's history has been reviewed, patient examined, no change in status, stable for surgery.  I have reviewed the patient's chart and labs.  Questions were answered to the patient's satisfaction.     Rolm Bookbinder

## 2023-01-11 NOTE — Anesthesia Procedure Notes (Signed)
Procedure Name: LMA Insertion Date/Time: 01/11/2023 10:28 AM  Performed by: Glory Buff, CRNAPre-anesthesia Checklist: Patient identified, Emergency Drugs available, Suction available and Patient being monitored Patient Re-evaluated:Patient Re-evaluated prior to induction Oxygen Delivery Method: Circle system utilized Preoxygenation: Pre-oxygenation with 100% oxygen Induction Type: IV induction LMA: LMA inserted LMA Size: 4.0 Number of attempts: 1 Placement Confirmation: positive ETCO2 Tube secured with: Tape Dental Injury: Teeth and Oropharynx as per pre-operative assessment

## 2023-01-11 NOTE — Discharge Instructions (Addendum)
[facility] Office Phone Number [phone]  POST OP INSTRUCTIONS Take 400 mg of ibuprofen every 8 hours or 650 mg tylenol every 6 hours for next 72 hours then as needed. Use ice several times daily also.  A prescription for pain medication may be given to you upon discharge.  Take your pain medication as prescribed, if needed.  If narcotic pain medicine is not needed, then you may take acetaminophen (Tylenol), naprosyn (Alleve) or ibuprofen (Advil) as needed. Take your usually prescribed medications unless otherwise directed If you need a refill on your pain medication, please contact your pharmacy.  They will contact our office to request authorization.  Prescriptions will not be filled after 5pm or on week-ends. You should eat very light the first 24 hours after surgery, such as soup, crackers, pudding, etc.  Resume your normal diet the day after surgery. Most patients will experience some swelling and bruising in the breast.  Ice packs and a good support bra will help.  Wear the breast binder provided or a sports bra for 72 hours day and night.  After that wear a sports bra during the day until you return to the office. Swelling and bruising can take several days to resolve.  It is common to experience some constipation if taking pain medication after surgery.  Increasing fluid intake and taking a stool softener will usually help or prevent this problem from occurring.  A mild laxative (Milk of Magnesia or Miralax) should be taken according to package directions if there are no bowel movements after 48 hours. I used skin glue on the incision, you may shower in 24 hours.  The glue will flake off over the next 2-3 weeks.  Any sutures or staples will be removed at the office during your follow-up visit. ACTIVITIES:  You may resume regular daily activities (gradually increasing) beginning the next day.  Wearing a good support bra or sports bra minimizes pain and swelling.  You may have  sexual intercourse when it is comfortable. You may drive when you no longer are taking prescription pain medication, you can comfortably wear a seatbelt, and you can safely maneuver your car and apply brakes. RETURN TO WORK:  ______________________________________________________________________________________ Dennis Bast should see your doctor in the office for a follow-up appointment approximately two weeks after your surgery.  Your doctor's nurse will typically make your follow-up appointment when she calls you with your pathology report.  Expect your pathology report 3-4 business days after your surgery.  You may call to check if you do not hear from [place] after three days. OTHER INSTRUCTIONS: _______________________________________________________________________________________________ _____________________________________________________________________________________________________________________________________ _____________________________________________________________________________________________________________________________________ _____________________________________________________________________________________________________________________________________  WHEN TO CALL DR WAKEFIELD: Fever over 101.0 Nausea and/or vomiting. Extreme swelling or bruising. Continued bleeding from incision. Increased pain, redness, or drainage from the incision.  The clinic staff is available to answer your questions during regular business hours.  Please don't hesitate to call and ask to speak to one of the nurses for clinical concerns.  If you have a medical emergency, go to the nearest emergency room or call 911.  A surgeon from [facility] is always on call at the hospital.  For further questions, please visit [email or web address]  May take Tylenol after 2:30pm, if needed.   Post Anesthesia Home Care Instructions  Activity: Get plenty of rest for the remainder of the  day. A responsible individual must stay with you for 24 hours following the procedure.  For the next 24 hours, DO NOT: -Drive a car Film/video editor -Drink  alcoholic beverages -Take any medication unless instructed by your physician -Make any legal decisions or sign important papers.  Meals: Start with liquid foods such as gelatin or soup. Progress to regular foods as tolerated. Avoid greasy, spicy, heavy foods. If nausea and/or vomiting occur, drink only clear liquids until the nausea and/or vomiting subsides. Call your physician if vomiting continues.  Special Instructions/Symptoms: Your throat may feel dry or sore from the anesthesia or the breathing tube placed in your throat during surgery. If this causes discomfort, gargle with warm salt water. The discomfort should disappear within 24 hours.  If you had a scopolamine patch placed behind your ear for the management of post- operative nausea and/or vomiting:  1. The medication in the patch is effective for 72 hours, after which it should be removed.  Wrap patch in a tissue and discard in the trash. Wash hands thoroughly with soap and water. 2. You may remove the patch earlier than 72 hours if you experience unpleasant side effects which may include dry mouth, dizziness or visual disturbances. 3. Avoid touching the patch. Wash your hands with soap and water after contact with the patch.

## 2023-01-11 NOTE — Anesthesia Preprocedure Evaluation (Addendum)
Anesthesia Evaluation  Patient identified by MRN, date of birth, ID band Patient awake    Reviewed: Allergy & Precautions, H&P , NPO status , Patient's Chart, lab work & pertinent test results  Airway Mallampati: II  TM Distance: >3 FB Neck ROM: Full    Dental no notable dental hx.    Pulmonary neg pulmonary ROS   Pulmonary exam normal breath sounds clear to auscultation       Cardiovascular hypertension, Pt. on medications Normal cardiovascular exam Rhythm:Regular Rate:Normal     Neuro/Psych negative neurological ROS  negative psych ROS   GI/Hepatic negative GI ROS, Neg liver ROS,,,  Endo/Other    Morbid obesity  Renal/GU negative Renal ROS  negative genitourinary   Musculoskeletal negative musculoskeletal ROS (+)    Abdominal  (+) + obese  Peds negative pediatric ROS (+)  Hematology negative hematology ROS (+)   Anesthesia Other Findings   Reproductive/Obstetrics negative OB ROS                             Anesthesia Physical Anesthesia Plan  ASA: 3  Anesthesia Plan: General   Post-op Pain Management: Tylenol PO (pre-op)*   Induction: Intravenous  PONV Risk Score and Plan: 3 and Ondansetron, Dexamethasone, Midazolam and Treatment may vary due to age or medical condition  Airway Management Planned: LMA  Additional Equipment:   Intra-op Plan:   Post-operative Plan: Extubation in OR  Informed Consent: I have reviewed the patients History and Physical, chart, labs and discussed the procedure including the risks, benefits and alternatives for the proposed anesthesia with the patient or authorized representative who has indicated his/her understanding and acceptance.     Dental advisory given  Plan Discussed with: CRNA  Anesthesia Plan Comments:        Anesthesia Quick Evaluation

## 2023-01-12 ENCOUNTER — Encounter (HOSPITAL_BASED_OUTPATIENT_CLINIC_OR_DEPARTMENT_OTHER): Payer: Self-pay | Admitting: General Surgery

## 2023-01-13 LAB — SURGICAL PATHOLOGY

## 2023-05-23 ENCOUNTER — Ambulatory Visit
Admission: EM | Admit: 2023-05-23 | Discharge: 2023-05-23 | Disposition: A | Discharge status: Home / Self Care | Payer: BC Managed Care – PPO | Attending: Physician Assistant | Admitting: Physician Assistant

## 2023-05-23 ENCOUNTER — Ambulatory Visit: Payer: BC Managed Care – PPO

## 2023-05-23 DIAGNOSIS — S92531A Displaced fracture of distal phalanx of right lesser toe(s), initial encounter for closed fracture: Secondary | ICD-10-CM | POA: Diagnosis not present

## 2023-05-23 DIAGNOSIS — S90229A Contusion of unspecified lesser toe(s) with damage to nail, initial encounter: Secondary | ICD-10-CM

## 2023-05-23 DIAGNOSIS — L03116 Cellulitis of left lower limb: Secondary | ICD-10-CM

## 2023-05-23 MED ORDER — CEPHALEXIN 500 MG PO CAPS: 500.0000 mg | ORAL_CAPSULE | Freq: Four times a day (QID) | ORAL | 0 refills | Status: AC

## 2023-05-23 MED ORDER — HYDROCODONE-ACETAMINOPHEN 5-325 MG PO TABS: 1.0000 | ORAL_TABLET | Freq: Four times a day (QID) | ORAL | 0 refills | Status: AC | PRN

## 2023-05-23 MED ORDER — KETOROLAC TROMETHAMINE 60 MG/2ML IM SOLN
30.0000 mg | Freq: Once | INTRAMUSCULAR | Status: AC
  Administered 2023-05-23: 30 mg via INTRAMUSCULAR

## 2023-05-23 NOTE — Discharge Instructions (Addendum)
-  There is a fracture of the distal aspect of the toe.  We have cleaned it and buddy taped it to the opposite toe.  It takes a few weeks for toe fracture to heal. Avoid weightbearing. May follow up with orthopedics since you have toenail injury with this "slightly" displaced fracture.   You have a condition requiring you to follow up with Orthopedics so please call one of the following office for appointment:   [facility] [address] [place], [place] [address] Phone: [phone]  There is also a [facility] in [place] now.  [facility] [address] [place], [place] [address] Phone: [phone]   Take ibuprofen and Tylenol for pain. Take Norco if needed for severe pain. Clean wounds. The toenail will likely fall off in time. Just clean the area well and keep bandage on.  Additionally, you have an infection of the left leg.  I have sent antibiotic to the pharmacy.  You should be noticing decrease in redness and swelling over the next 2 to 3 days.  However, if you notice the redness and swelling spread outside the line I drew with a skin marker or you develop increased pain, fever you should go to the emergency department.

## 2023-05-23 NOTE — ED Triage Notes (Signed)
Pt fell and hurt left leg on 05/07/23 now having redness on calf with swelling in foot and ankle. Then pt fell down the stairs this morning and think she may have broke her right 4th toe.

## 2023-05-23 NOTE — ED Provider Notes (Signed)
[facility]    CSN: [id number] Arrival date & time: 05/23/23  1404      History   Chief Complaint Chief Complaint  Patient presents with   Toe Pain   Leg Pain    HPI Claire HELMLINGER is a 42 y.o. female presenting for multiple complaints.  Patient reports 3-day history of redness, swelling and pain of the left anterior lower leg.  Patient reports that she fell and injured this area about 2 to 3 weeks ago.  Unsure if this is related.  She says she never had a cut or any bruising or swelling in this area until recently.  History of abscess of muscle of the right lower leg about 20 years ago.  Denies history of MRSA or recurrent skin infections.  No fever.  Patient also reports that she recently fell down some stairs this morning and injured her right fourth toe.  She reports pain of the distal toe and bleeding.  She is afraid it could be broken.  HPI  Past Medical History:  Diagnosis Date   BRCA negative 04/2016   MyRisk neg   Dysplastic nevus 07/05/2008   Mid lower back - mild    Dysplastic nevus 01/25/2020   R upper back sup med scapula - moderate   Dysplastic nevus 06/11/2021   right lat neck at hairline - severe - margins clear 08/12/21   Dysplastic nevus 06/11/2021   right prox ant thigh near groin - moderate   Family history of ovarian cancer    mother and mat aunt   Hypertension    Menorrhagia    PCOS (polycystic ovarian syndrome)    Spina bifida without hydrocephalus 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 ribbon clip/ path pending   BREAST BIOPSY Left 10/19/2022   [place] LT BREAST BX W LOC DEV 1ST LESION IMG BX SPEC US GUIDE 10/19/2022 [facility]   BREAST BIOPSY  01/08/2023    MM LT RADIOACTIVE SEED LOC MAMMO GUIDE 01/08/2023 [facility]   CESAREAN SECTION  2009/2012   HYSTEROSCOPY WITH D & C  01/20/2013   Severe menorrhagia   ([facility])   RADIOACTIVE SEED GUIDED EXCISIONAL BREAST BIOPSY Left 01/11/2023   Procedure: RADIOACTIVE SEED GUIDED EXCISIONAL LEFT BREAST BIOPSY;  Surgeon: Emelia Loron, MD;  Location: [facility];  Service: General;  Laterality: Left;   TUBAL LIGATION      OB History     Gravida  2   Para  2   Term  2   Preterm      AB  0   Living  2      SAB      IAB      Ectopic      Multiple      Live Births  2            Home Medications    Prior to Admission medications   Medication Sig Start Date End Date Taking? Authorizing Provider  cephALEXin (KEFLEX) 500 MG capsule Take 1 capsule (500 mg total) by mouth 4 (four) times daily for 7 days. 05/23/23 05/30/23 Yes Shirlee Latch, PA-C  HYDROcodone-acetaminophen (NORCO/VICODIN) 5-325 MG tablet Take 1 tablet by mouth  every 6 (six) hours as needed for up to 3 days for moderate pain. 05/23/23 05/26/23 Yes Shirlee Latch, PA-C  ketoconazole (NIZORAL) 2 % shampoo Shampoo into scalp let sit 5 minutes then wash out. Use 3d/wk. 06/17/22  Yes Deirdre Evener, MD  Multiple Vitamin (MULTI-VITAMINS) TABS Take by mouth. 10/17/10  Yes [provider]  olmesartan-hydrochlorothiazide (BENICAR HCT) 40-12.5 MG tablet Take 1 tablet by mouth daily. 06/10/20  Yes [provider]  rosuvastatin (CRESTOR) 5 MG tablet Take 5 mg by mouth daily. 09/13/20  Yes [provider]  triamterene-hydrochlorothiazide (DYAZIDE) 37.5-25 MG capsule Take by mouth. 12/17/20 05/23/23 Yes [provider]  levonorgestrel (MIRENA) 20 MCG/24HR IUD 1 each by Intrauterine route once.    [provider]  traMADol (ULTRAM) 50 MG tablet Take 1 tablet (50 mg total) by mouth every 6 (six) hours as needed. 01/11/23   Emelia Loron, MD    Family History Family  History  Problem Relation Age of Onset   Cervical cancer Mother    Ovarian cancer Mother 53   Hyperlipidemia Mother    Stroke Mother    Hypertension Father    Ovarian cancer Maternal Aunt        late 20's   Hyperlipidemia Maternal Grandfather    Hypertension Paternal Grandmother    Melanoma Paternal Grandfather 51   Prostate cancer Paternal Grandfather    Breast cancer Neg Hx     Social History Social History   Tobacco Use   Smoking status: Never   Smokeless tobacco: Never  Vaping Use   Vaping Use: Never used  Substance Use Topics   Alcohol use: Yes    Comment: infrequent   Drug use: No     Allergies   Latex and Sulfa antibiotics   Review of Systems Review of Systems  Constitutional:  Negative for fatigue and fever.  Musculoskeletal:  Positive for arthralgias, gait problem and joint swelling.  Skin:  Positive for color change and wound.  Neurological:  Negative for weakness and numbness.     Physical Exam Triage Vital Signs ED Triage Vitals  Enc Vitals Group     BP      Pulse      Resp      Temp      Temp src      SpO2      Weight      Height      Head Circumference      Peak Flow      Pain Score      Pain Loc      Pain Edu?      Excl. in GC?    No data found.  Updated Vital Signs BP 128/83 (BP Location: Right Arm)   Pulse 77   Temp 98.2 F (36.8 C) (Oral)   Resp 16   SpO2 95%       Physical Exam Vitals and nursing note reviewed.  Constitutional:      General: She is not in acute distress.    Appearance: Normal appearance. She is not ill-appearing or toxic-appearing.  HENT:     Head: Normocephalic and atraumatic.  Eyes:     General: No scleral icterus.       Right eye: No discharge.        Left eye: No discharge.     Conjunctiva/sclera: Conjunctivae normal.  Cardiovascular:     Rate and Rhythm: Normal rate and regular rhythm.  Pulmonary:     Effort: Pulmonary  effort is normal. No respiratory distress.  Musculoskeletal:      Cervical back: Neck supple.     Comments: RIGHT TOE: There is swelling and contusion of right 4th toe. TTP of distal toe.  Bleeding under and around the toenail.  Skin:    General: Skin is dry.     Findings: Erythema (Area of erythema and induration 7 x 8 cm anterior left lower leg. TTP) present.  Neurological:     General: No focal deficit present.     Mental Status: She is alert. Mental status is at baseline.     Motor: No weakness.     Gait: Gait normal.  Psychiatric:        Mood and Affect: Mood normal.        Behavior: Behavior normal.        Thought Content: Thought content normal.         UC Treatments / Results  Labs (all labs ordered are listed, but only abnormal results are displayed) Labs Reviewed - No data to display  EKG   Radiology DG Toe 4th Right  Result Date: 05/23/2023 CLINICAL DATA:  Fourth toe pain after fall. EXAM: RIGHT FOURTH TOE COMPARISON:  None Available. FINDINGS: Mildly displaced fracture of the fourth toe distal phalanx. Fracture likely extends to the distal interphalangeal joint. No additional fracture of the digit. The adjacent toes are intact were visualized. There is mild soft tissue edema. IMPRESSION: Mildly displaced fracture of the fourth toe distal phalanx. Electronically Signed   By: Narda Rutherford M.D.   On: 05/23/2023 15:20    Procedures Procedures (including critical care time)  Medications Ordered in UC Medications  ketorolac (TORADOL) injection 30 mg (has no administration in time range)    Initial Impression / Assessment and Plan / UC Course  I have reviewed the triage vital signs and the nursing notes.  Pertinent labs & imaging results that were available during my care of the patient were reviewed by me and considered in my medical decision making (see chart for details).   42 year old female presents for multiple complaints.  First she states she has redness, swelling and pain of the left anterior lower leg for the past 4  days.  She also reports injury to the right fourth toe after a fall today.  See images included in chart.  Patient does have large area of erythema and induration consistent with cellulitis of the left anterior lower leg measuring 7 cm x 8 cm.  She also has contusion, swelling of the fourth toe of the right foot.  Bleeding around his toenail.  X-ray of right fourth toe obtained.  X-ray shows mildly displaced fracture of the distal phalanx.  Given that she has subungual hematoma and bleeding around the toenail, advised her to follow-up with orthopedics.  Information given to follow-up with Ortho.  Toe thoroughly cleaned by nursing staff and buddy taped to third toe.  Patient has crutches at home she will use.  Patient has complicated fracture and will need to follow-up with specialist.  Will treat cellulitis with Keflex.  Advised patient to go to the emergency department if the redness or swelling worsen or if she develops increased pain or fever.  Reviewed fracture care guidelines and wound care guidelines.  Patient has significant pain related to both conditions.  Patient given 30 mg IM ketorolac in clinic.  I sent Norco to the pharmacy after reviewing controlled substance database and finding her to be low risk for abuse.  Final Clinical Impressions(s) / UC Diagnoses   Final diagnoses:  Closed displaced fracture of distal phalanx of lesser toe of right foot, initial encounter  Cellulitis of leg, left  Subungual hematoma of fourth toe     Discharge Instructions      -There is a fracture of the distal aspect of the toe.  We have cleaned it and buddy taped it to the opposite toe.  It takes a few weeks for toe fractures to heal. May follow up with orthopedics   You have a condition requiring you to follow up with Orthopedics so please call one of the following office for appointment:   [facility] [address] [place], [place] [address] Phone: [phone]  [facility] [address] [place], [place] [address] Phone: [phone]   Take ibuprofen and Tylenol for pain. Clean wounds. The toenail will likely fall off in time. Just clean the area well and keep bandage on.  Additionally, you have an infection of the left leg.  I have sent antibiotic to the pharmacy.  You should be noticing decrease in redness and swelling over the next 2 to 3 days.  However, if you notice the redness and swelling spread outside the line I drew with a skin marker or you develop increased pain, fever you should go to the emergency department.     ED Prescriptions     Medication Sig Dispense Auth. Provider   cephALEXin (KEFLEX) 500 MG capsule Take 1 capsule (500 mg total) by mouth 4 (four) times daily for 7 days. 28 capsule Eusebio Friendly B, PA-C   HYDROcodone-acetaminophen (NORCO/VICODIN) 5-325 MG tablet Take 1 tablet by mouth every 6 (six) hours as needed for up to 3 days for moderate pain. 10 tablet Shirlee Latch, PA-C      I have reviewed the PDMP during this encounter.   Shirlee Latch, PA-C 05/23/23 1547

## 2023-07-28 ENCOUNTER — Ambulatory Visit (INDEPENDENT_AMBULATORY_CARE_PROVIDER_SITE_OTHER): Payer: BC Managed Care – PPO | Admitting: Dermatology

## 2023-07-28 VITALS — BP 132/77

## 2023-07-28 DIAGNOSIS — L578 Other skin changes due to chronic exposure to nonionizing radiation: Secondary | ICD-10-CM | POA: Diagnosis not present

## 2023-07-28 DIAGNOSIS — Z1283 Encounter for screening for malignant neoplasm of skin: Secondary | ICD-10-CM

## 2023-07-28 DIAGNOSIS — D1801 Hemangioma of skin and subcutaneous tissue: Secondary | ICD-10-CM

## 2023-07-28 DIAGNOSIS — L814 Other melanin hyperpigmentation: Secondary | ICD-10-CM | POA: Diagnosis not present

## 2023-07-28 DIAGNOSIS — D229 Melanocytic nevi, unspecified: Secondary | ICD-10-CM

## 2023-07-28 DIAGNOSIS — Z872 Personal history of diseases of the skin and subcutaneous tissue: Secondary | ICD-10-CM

## 2023-07-28 DIAGNOSIS — Z79899 Other long term (current) drug therapy: Secondary | ICD-10-CM

## 2023-07-28 DIAGNOSIS — Z808 Family history of malignant neoplasm of other organs or systems: Secondary | ICD-10-CM

## 2023-07-28 DIAGNOSIS — Z7189 Other specified counseling: Secondary | ICD-10-CM

## 2023-07-28 DIAGNOSIS — W908XXA Exposure to other nonionizing radiation, initial encounter: Secondary | ICD-10-CM | POA: Diagnosis not present

## 2023-07-28 DIAGNOSIS — L821 Other seborrheic keratosis: Secondary | ICD-10-CM

## 2023-07-28 DIAGNOSIS — Z86018 Personal history of other benign neoplasm: Secondary | ICD-10-CM

## 2023-07-28 DIAGNOSIS — L219 Seborrheic dermatitis, unspecified: Secondary | ICD-10-CM

## 2023-07-28 DIAGNOSIS — L218 Other seborrheic dermatitis: Secondary | ICD-10-CM

## 2023-07-28 DIAGNOSIS — L409 Psoriasis, unspecified: Secondary | ICD-10-CM

## 2023-07-28 MED ORDER — KETOCONAZOLE 2 % EX SHAM: 1.0000 | MEDICATED_SHAMPOO | CUTANEOUS | 11 refills | Status: DC

## 2023-07-28 MED ORDER — TRETINOIN 0.025 % EX CREA: TOPICAL_CREAM | Freq: Every day | CUTANEOUS | 11 refills | Status: AC

## 2023-07-28 NOTE — Progress Notes (Unsigned)
Follow-Up Visit   Subjective  Claire Olsen is a 42 y.o. female who presents for the following: Skin Cancer Screening and Full Body Skin Exam, hx of Dysplastic Nevi, Psoriasis/Sebo Psoriasis, scalp Ketoconazole 2% shampoo prn flares ~2x/wk The patient presents for Total-Body Skin Exam (TBSE) for skin cancer screening and mole check. The patient has spots, moles and lesions to be evaluated, some may be new or changing and the patient may have concern these could be cancer.   The following portions of the chart were reviewed this encounter and updated as appropriate: medications, allergies, medical history  Review of Systems:  No other skin or systemic complaints except as noted in HPI or Assessment and Plan.  Objective  Well appearing patient in no apparent distress; mood and affect are within normal limits.  A full examination was performed including scalp, head, eyes, ears, nose, lips, neck, chest, axillae, abdomen, back, buttocks, bilateral upper extremities, bilateral lower extremities, hands, feet, fingers, toes, fingernails, and toenails. All findings within normal limits unless otherwise noted below.   Relevant physical exam findings are noted in the Assessment and Plan.    Assessment & Plan   SKIN CANCER SCREENING PERFORMED TODAY.  ACTINIC DAMAGE - Chronic condition, secondary to cumulative UV/sun exposure - diffuse scaly erythematous macules with underlying dyspigmentation - Recommend daily broad spectrum sunscreen SPF 30+ to sun-exposed areas, reapply every 2 hours as needed.  - Staying in the shade or wearing long sleeves, sun glasses (UVA+UVB protection) and wide brim hats (4-inch brim around the entire circumference of the hat) are also recommended for sun protection.  - Call for new or changing lesions.  LENTIGINES, SEBORRHEIC KERATOSES, HEMANGIOMAS - Benign normal skin lesions - Benign-appearing - Call for any changes  MELANOCYTIC NEVI - Tan-brown and/or  pink-flesh-colored symmetric macules and papules - Benign appearing on exam today - Observation - Call clinic for new or changing moles - Recommend daily use of broad spectrum spf 30+ sunscreen to sun-exposed areas.   FAMILY HISTORY OF SKIN CANCER What type(s):Melanoma Who affected:Paternal grandfather   HISTORY OF DYSPLASTIC NEVUS No evidence of recurrence today Recommend regular full body skin exams Recommend daily broad spectrum sunscreen SPF 30+ to sun-exposed areas, reapply every 2 hours as needed.  Call if any new or changing lesions are noted between office visits  - Mid lower back, R upper back sup med scapula, R lat neck at hairline, R prox ant thigh near groin  PSORIASIS / SEBO PSORIASIS scalp Exam:  scalp clear today 0% BSA.  Chronic condition with duration or expected duration over one year. Currently well-controlled.  patient denies joint pain  Psoriasis is a chronic non-curable, but treatable genetic/hereditary disease that may have other systemic features affecting other organ systems such as joints (Psoriatic Arthritis). It is associated with an increased risk of inflammatory bowel disease, heart disease, non-alcoholic fatty liver disease, and depression.  Treatments include light and laser treatments; topical medications; and systemic medications including oral and injectables.  Treatment Plan: Cont Ketoconazole 2% shampoo 2x/month prn itching, may use more frequently if needed   Long term medication management.  Patient is using long term (months to years) prescription medication  to control their dermatologic condition.  These medications require periodic monitoring to evaluate for efficacy and side effects and may require periodic laboratory monitoring.   HISTORY OF CELLULITIS L lower extremity Exam: L lower leg clear today Treatment Plan: Resolved  SEBORRHEIC KERATOSIS / LENTIGOS / ACTINIC CHANGES Exam: brown macules and actinic  changes face  Treatment  Plan: Retin a 0.025% cr qhs to face as tolerated  Topical retinoid medications like tretinoin/Retin-A, adapalene/Differin, tazarotene/Fabior, and Epiduo/Epiduo Forte can cause dryness and irritation when first started. Only apply a pea-sized amount to the entire affected area. Avoid applying it around the eyes, edges of mouth and creases at the nose. If you experience irritation, use a good moisturizer first and/or apply the medicine less often. If you are doing well with the medicine, you can increase how often you use it until you are applying every night. Be careful with sun protection while using this medication as it can make you sensitive to the sun. This medicine should not be used by pregnant women.    Return in about 1 year (around 07/27/2024).  I, Ardis Rowan, RMA, am acting as scribe for Armida Sans, MD .  Documentation: I have reviewed the above documentation for accuracy and completeness, and I agree with the above.  Armida Sans, MD

## 2023-07-28 NOTE — Patient Instructions (Signed)

## 2023-07-29 ENCOUNTER — Encounter: Payer: Self-pay | Admitting: Dermatology

## 2023-09-21 ENCOUNTER — Other Ambulatory Visit: Payer: Self-pay | Admitting: Internal Medicine

## 2023-09-21 DIAGNOSIS — Z1231 Encounter for screening mammogram for malignant neoplasm of breast: Secondary | ICD-10-CM

## 2023-10-13 ENCOUNTER — Inpatient Hospital Stay: Admission: RE | Admit: 2023-10-13 | Payer: BC Managed Care – PPO | Source: Ambulatory Visit

## 2023-11-02 ENCOUNTER — Ambulatory Visit
Admission: RE | Admit: 2023-11-02 | Discharge: 2023-11-02 | Disposition: A | Discharge status: Home / Self Care | Payer: BC Managed Care – PPO | Source: Ambulatory Visit | Attending: Internal Medicine | Admitting: Internal Medicine

## 2023-11-02 DIAGNOSIS — Z1231 Encounter for screening mammogram for malignant neoplasm of breast: Secondary | ICD-10-CM | POA: Insufficient documentation

## 2024-07-27 ENCOUNTER — Ambulatory Visit: Payer: BC Managed Care – PPO | Admitting: Dermatology

## 2024-08-21 ENCOUNTER — Ambulatory Visit: Admitting: Dermatology

## 2024-08-21 ENCOUNTER — Encounter: Payer: Self-pay | Admitting: Dermatology

## 2024-08-21 DIAGNOSIS — D224 Melanocytic nevi of scalp and neck: Secondary | ICD-10-CM

## 2024-08-21 DIAGNOSIS — L814 Other melanin hyperpigmentation: Secondary | ICD-10-CM | POA: Diagnosis not present

## 2024-08-21 DIAGNOSIS — L409 Psoriasis, unspecified: Secondary | ICD-10-CM

## 2024-08-21 DIAGNOSIS — L821 Other seborrheic keratosis: Secondary | ICD-10-CM

## 2024-08-21 DIAGNOSIS — L720 Epidermal cyst: Secondary | ICD-10-CM

## 2024-08-21 DIAGNOSIS — Z1283 Encounter for screening for malignant neoplasm of skin: Secondary | ICD-10-CM | POA: Diagnosis not present

## 2024-08-21 DIAGNOSIS — Z808 Family history of malignant neoplasm of other organs or systems: Secondary | ICD-10-CM

## 2024-08-21 DIAGNOSIS — D2271 Melanocytic nevi of right lower limb, including hip: Secondary | ICD-10-CM

## 2024-08-21 DIAGNOSIS — Z79899 Other long term (current) drug therapy: Secondary | ICD-10-CM

## 2024-08-21 DIAGNOSIS — L0291 Cutaneous abscess, unspecified: Secondary | ICD-10-CM

## 2024-08-21 DIAGNOSIS — L578 Other skin changes due to chronic exposure to nonionizing radiation: Secondary | ICD-10-CM

## 2024-08-21 DIAGNOSIS — W908XXA Exposure to other nonionizing radiation, initial encounter: Secondary | ICD-10-CM

## 2024-08-21 DIAGNOSIS — D229 Melanocytic nevi, unspecified: Secondary | ICD-10-CM

## 2024-08-21 DIAGNOSIS — L709 Acne, unspecified: Secondary | ICD-10-CM

## 2024-08-21 DIAGNOSIS — D2272 Melanocytic nevi of left lower limb, including hip: Secondary | ICD-10-CM

## 2024-08-21 DIAGNOSIS — D492 Neoplasm of unspecified behavior of bone, soft tissue, and skin: Secondary | ICD-10-CM

## 2024-08-21 DIAGNOSIS — Z7189 Other specified counseling: Secondary | ICD-10-CM

## 2024-08-21 DIAGNOSIS — Z86018 Personal history of other benign neoplasm: Secondary | ICD-10-CM

## 2024-08-21 DIAGNOSIS — L820 Inflamed seborrheic keratosis: Secondary | ICD-10-CM

## 2024-08-21 DIAGNOSIS — L700 Acne vulgaris: Secondary | ICD-10-CM

## 2024-08-21 MED ORDER — TRETINOIN 0.025 % EX CREA: TOPICAL_CREAM | Freq: Every day | CUTANEOUS | 11 refills | Status: AC

## 2024-08-21 MED ORDER — KETOCONAZOLE 2 % EX SHAM: 1.0000 | MEDICATED_SHAMPOO | CUTANEOUS | 11 refills | Status: AC

## 2024-08-21 NOTE — Progress Notes (Signed)
 Follow-Up Visit   Subjective  Claire Olsen is a 43 y.o. female who presents for the following: Skin Cancer Screening and Full Body Skin Exam, hx of Dysplastic nevus  Refill Ketoconazole  shampoo for rash on the scalp, patient using Retin A cream on her face every other night due to irritation..   The patient presents for Total-Body Skin Exam (TBSE) for skin cancer screening and mole check. The patient has spots, moles and lesions to be evaluated, some may be new or changing and the patient may have concern these could be cancer.  The following portions of the chart were reviewed this encounter and updated as appropriate: medications, allergies, medical history  Review of Systems:  No other skin or systemic complaints except as noted in HPI or Assessment and Plan.  Objective  Well appearing patient in no apparent distress; mood and affect are within normal limits.  A full examination was performed including scalp, head, eyes, ears, nose, lips, neck, chest, axillae, abdomen, back, buttocks, bilateral upper extremities, bilateral lower extremities, hands, feet, fingers, toes, fingernails, and toenails. All findings within normal limits unless otherwise noted below.   Relevant physical exam findings are noted in the Assessment and Plan.  right lower eyelid - complicated Subcutaneous papule/nodule with erythema and edema, tender to touch.  neck x 15 (15) Stuck-on, waxy, tan-brown papules and plaques -- Discussed benign etiology and prognosis.  right inframammary 1.5 cm irregular papule  left medial thigh near groin anterior 0.6 cm dark brown macule  Left medial thigh near groin posterior 0.7 cm flesh papule  right post lateral neck 0.6 cm irregular brown macule            Assessment & Plan   SKIN CANCER SCREENING PERFORMED TODAY.  ACTINIC DAMAGE - Chronic condition, secondary to cumulative UV/sun exposure - diffuse scaly erythematous macules with underlying  dyspigmentation - Recommend daily broad spectrum sunscreen SPF 30+ to sun-exposed areas, reapply every 2 hours as needed.  - Staying in the shade or wearing long sleeves, sun glasses (UVA+UVB protection) and wide brim hats (4-inch brim around the entire circumference of the hat) are also recommended for sun protection.  - Call for new or changing lesions.  LENTIGINES, SEBORRHEIC KERATOSES, HEMANGIOMAS - Benign normal skin lesions - Benign-appearing - Call for any changes  MELANOCYTIC NEVI - Tan-brown and/or pink-flesh-colored symmetric macules and papules - Benign appearing on exam today - Observation - Call clinic for new or changing moles - Recommend daily use of broad spectrum spf 30+ sunscreen to sun-exposed areas.   PSORIASIS / SEBO PSORIASIS scalp Exam:  scalp clear today 0% BSA. Chronic condition with duration or expected duration over one year. Currently well-controlled. patient denies joint pain Psoriasis is a chronic non-curable, but treatable genetic/hereditary disease that may have other systemic features affecting other organ systems such as joints (Psoriatic Arthritis). It is associated with an increased risk of inflammatory bowel disease, heart disease, non-alcoholic fatty liver disease, and depression.  Treatments include light and laser treatments; topical medications; and systemic medications including oral and injectables. Treatment Plan: Cont Ketoconazole  2% shampoo 2x/month prn itching, may use more frequently if needed  Long term medication management.  Patient is using long term (months to years) prescription medication  to control their dermatologic condition.  These medications require periodic monitoring to evaluate for efficacy and side effects and may require periodic laboratory monitoring.     FAMILY HISTORY OF SKIN CANCER What type(s):Melanoma Who affected:Paternal grandfather    HISTORY OF  DYSPLASTIC NEVUS No evidence of recurrence today Recommend  regular full body skin exams Recommend daily broad spectrum sunscreen SPF 30+ to sun-exposed areas, reapply every 2 hours as needed.  Call if any new or changing lesions are noted between office visits  - Mid lower back, R upper back sup med scapula, R lat neck at hairline, R prox ant thigh near groin  SEBORRHEIC KERATOSIS / LENTIGOS / ACTINIC CHANGES Benign. Benign-appearing.  Observation.  Call clinic for new or changing lesions.  Recommend daily use of broad spectrum spf 30+ sunscreen to sun-exposed areas.    Acne; Comedones and Milia and sebaceous hyperplasia Face Exam: comedones and white papules and yellow papules face Treatment Plan: Continue Retin a 0.025% cr qhs to face as tolerated Apply a moisturizer before applying Retin A  Topical retinoid medications like tretinoin /Retin-A , adapalene/Differin, tazarotene/Fabior, and Epiduo/Epiduo Forte can cause dryness and irritation when first started. Only apply a pea-sized amount to the entire affected area. Avoid applying it around the eyes, edges of mouth and creases at the nose. If you experience irritation, use a good moisturizer first and/or apply the medicine less often. If you are doing well with the medicine, you can increase how often you use it until you are applying every night. Be careful with sun protection while using this medication as it can make you sensitive to the sun. This medicine should not be used by pregnant women.      EPIDERMAL CYST right lower eyelid - complicated Inflamed irritated cyst with abscess Incision and Drainage - right lower eyelid - complicated Location: right lower eyelid   Informed Consent: Discussed risks (permanent scarring, light or dark discoloration, infection, pain, bleeding, bruising, redness, damage to adjacent structures, and recurrence of the lesion) and benefits of the procedure, as well as the alternatives.  Informed consent was obtained.  Preparation: The area was prepped with  alcohol.  Anesthesia: Lidocaine  1% with epinephrine   Procedure Details: An incision was made overlying the lesion. The lesion drained pus.  A small amount of fluid was drained.    Antibiotic ointment and a sterile pressure dressing were applied. The patient tolerated procedure well.  Total number of lesions drained: 1  Plan: The patient was instructed on post-op care. Recommend OTC analgesia as needed for pain.   INFLAMED SEBORRHEIC KERATOSIS (15) neck x 15 (15) Symptomatic, irritating, patient would like treated.  Destruction of lesion - neck x 15 (15) Complexity: simple   Destruction method: cryotherapy   Informed consent: discussed and consent obtained   Timeout:  patient name, date of birth, surgical site, and procedure verified Lesion destroyed using liquid nitrogen: Yes   Region frozen until ice ball extended beyond lesion: Yes   Outcome: patient tolerated procedure well with no complications   Post-procedure details: wound care instructions given    NEOPLASM OF SKIN (4) right inframammary Epidermal / dermal shaving  Lesion diameter (cm):  1.5 Informed consent: discussed and consent obtained   Timeout: patient name, date of birth, surgical site, and procedure verified   Procedure prep:  Patient was prepped and draped in usual sterile fashion Prep type:  Isopropyl alcohol Anesthesia: the lesion was anesthetized in a standard fashion   Anesthetic:  1% lidocaine  w/ epinephrine  1-100,000 buffered w/ 8.4% NaHCO3 Hemostasis achieved with: pressure, aluminum chloride and electrodesiccation   Outcome: patient tolerated procedure well   Post-procedure details: sterile dressing applied and wound care instructions given   Dressing type: bandage and petrolatum    Specimen 1 -  Surgical pathology Differential Diagnosis: R/O Dysplastic nevus vs other   Check Margins: No left medial thigh near groin anterior Epidermal / dermal shaving  Lesion diameter (cm):  0.6 Informed  consent: discussed and consent obtained   Timeout: patient name, date of birth, surgical site, and procedure verified   Procedure prep:  Patient was prepped and draped in usual sterile fashion Prep type:  Isopropyl alcohol Anesthesia: the lesion was anesthetized in a standard fashion   Anesthetic:  1% lidocaine  w/ epinephrine  1-100,000 buffered w/ 8.4% NaHCO3 Hemostasis achieved with: pressure, aluminum chloride and electrodesiccation   Outcome: patient tolerated procedure well   Post-procedure details: sterile dressing applied and wound care instructions given   Dressing type: bandage and petrolatum    Specimen 2 - Surgical pathology Differential Diagnosis: R/O Dysplastic nevus   Check Margins: No Left medial thigh near groin posterior Epidermal / dermal shaving  Lesion diameter (cm):  0.7 Informed consent: discussed and consent obtained   Timeout: patient name, date of birth, surgical site, and procedure verified   Procedure prep:  Patient was prepped and draped in usual sterile fashion Prep type:  Isopropyl alcohol Anesthesia: the lesion was anesthetized in a standard fashion   Anesthetic:  1% lidocaine  w/ epinephrine  1-100,000 buffered w/ 8.4% NaHCO3 Hemostasis achieved with: pressure, aluminum chloride and electrodesiccation   Outcome: patient tolerated procedure well   Post-procedure details: sterile dressing applied and wound care instructions given   Dressing type: bandage and petrolatum    Specimen 3 - Surgical pathology Differential Diagnosis: R/O Irritated skin tag vs other   Check Margins: No right post lateral neck Epidermal / dermal shaving  Lesion diameter (cm):  0.6 Informed consent: discussed and consent obtained   Timeout: patient name, date of birth, surgical site, and procedure verified   Procedure prep:  Patient was prepped and draped in usual sterile fashion Prep type:  Isopropyl alcohol Anesthesia: the lesion was anesthetized in a standard fashion    Anesthetic:  1% lidocaine  w/ epinephrine  1-100,000 buffered w/ 8.4% NaHCO3 Hemostasis achieved with: pressure, aluminum chloride and electrodesiccation   Outcome: patient tolerated procedure well   Post-procedure details: sterile dressing applied and wound care instructions given   Dressing type: bandage and petrolatum    Specimen 4 - Surgical pathology Differential Diagnosis: R/O Dysplastic nevus vs other   Check Margins: No SKIN CANCER SCREENING   LENTIGO   MELANOCYTIC NEVUS, UNSPECIFIED LOCATION   HISTORY OF DYSPLASTIC NEVUS   ACTINIC SKIN DAMAGE   FAMILY HISTORY OF SKIN CANCER   SEBORRHEIC KERATOSIS   ACNE VULGARIS   COUNSELING AND COORDINATION OF CARE   MEDICATION MANAGEMENT   ABSCESS     Return in about 1 year (around 08/21/2025) for TBSE, hx of Dysplastic nevus .  IFay Kirks, CMA, am acting as scribe for Alm Rhyme, MD .   Documentation: I have reviewed the above documentation for accuracy and completeness, and I agree with the above.  Alm Rhyme, MD

## 2024-08-21 NOTE — Patient Instructions (Addendum)
 Recommend daily broad spectrum sunscreen SPF 30+ to sun-exposed areas, reapply every 2 hours as needed. Call for new or changing lesions.  Staying in the shade or wearing long sleeves, sun glasses (UVA+UVB protection) and wide brim hats (4-inch brim around the entire circumference of the hat) are also recommended for sun protection.        Cryotherapy Aftercare  Wash gently with soap and water everyday.   Apply Vaseline and Band-Aid daily until healed.    Wound Care Instructions  Cleanse wound gently with soap and water once a day then pat dry with clean gauze. Apply a thin coat of Petrolatum (petroleum jelly, Vaseline) over the wound (unless you have an allergy to this). We recommend that you use a new, sterile tube of Vaseline. Do not pick or remove scabs. Do not remove the yellow or white healing tissue from the base of the wound.  Cover the wound with fresh, clean, nonstick gauze and secure with paper tape. You may use Band-Aids in place of gauze and tape if the wound is small enough, but would recommend trimming much of the tape off as there is often too much. Sometimes Band-Aids can irritate the skin.  You should call the office for your biopsy report after 1 week if you have not already been contacted.  If you experience any problems, such as abnormal amounts of bleeding, swelling, significant bruising, significant pain, or evidence of infection, please call the office immediately.  FOR ADULT SURGERY PATIENTS: If you need something for pain relief you may take 1 extra strength Tylenol  (acetaminophen ) AND 2 Ibuprofen (200mg  each) together every 4 hours as needed for pain. (do not take these if you are allergic to them or if you have a reason you should not take them.) Typically, you may only need pain medication for 1 to 3 days.         Due to recent changes in healthcare laws, you may see results of your pathology and/or laboratory studies on MyChart before the doctors  have had a chance to review them. We understand that in some cases there may be results that are confusing or concerning to you. Please understand that not all results are received at the same time and often the doctors may need to interpret multiple results in order to provide you with the best plan of care or course of treatment. Therefore, we ask that you please give us  2 business days to thoroughly review all your results before contacting the office for clarification. Should we see a critical lab result, you will be contacted sooner.   If You Need Anything After Your Visit  If you have any questions or concerns for your doctor, please call our main line at [phone] and press option 4 to reach your doctor's medical assistant. If no one answers, please leave a voicemail as directed and we will return your call as soon as possible. Messages left after 4 pm will be answered the following business day.   You may also send us  a message via MyChart. We typically respond to MyChart messages within 1-2 business days.  For prescription refills, please ask your pharmacy to contact our office. Our fax number is [phone].  If you have an urgent issue when the clinic is closed that cannot wait until the next business day, you can page your doctor at the number below.    Please note that while we do our best to be available for urgent issues outside of office  hours, we are not available 24/7.   If you have an urgent issue and are unable to reach us , you may choose to seek medical care at your doctor's office, retail clinic, urgent care center, or emergency room.  If you have a medical emergency, please immediately call 911 or go to the emergency department.  Pager Numbers  - Dr. Hester: [phone]  - Dr. Jackquline: [phone]  - Dr. Claudene: [phone]   - Dr. Raymund: [phone]  In the event of inclement weather, please call our main line at [phone] for an update on the status  of any delays or closures.  Dermatology Medication Tips: Please keep the boxes that topical medications come in in order to help keep track of the instructions about where and how to use these. Pharmacies typically print the medication instructions only on the boxes and not directly on the medication tubes.   If your medication is too expensive, please contact our office at [phone] option 4 or send us  a message through MyChart.   We are unable to tell what your co-pay for medications will be in advance as this is different depending on your insurance coverage. However, we may be able to find a substitute medication at lower cost or fill out paperwork to get insurance to cover a needed medication.   If a prior authorization is required to get your medication covered by your insurance company, please allow us  1-2 business days to complete this process.  Drug prices often vary depending on where the prescription is filled and some pharmacies may offer cheaper prices.  The website www.goodrx.com contains coupons for medications through different pharmacies. The prices here do not account for what the cost may be with help from insurance (it may be cheaper with your insurance), but the website can give you the price if you did not use any insurance.  - You can print the associated coupon and take it with your prescription to the pharmacy.  - You may also stop by our office during regular business hours and pick up a GoodRx coupon card.  - If you need your prescription sent electronically to a different pharmacy, notify our office through [facility] or by phone at [phone] option 4.     Si Usted Necesita Algo Despus de Su Visita  Tambin puede enviarnos un mensaje a travs de Clinical cytogeneticist. Por lo general respondemos a los mensajes de MyChart en el transcurso de 1 a 2 das hbiles.  Para renovar recetas, por favor pida a su farmacia que se ponga en contacto con nuestra oficina.  [place] de fax es [place] [phone].  Si tiene un asunto urgente cuando la clnica est cerrada y que no puede esperar hasta el siguiente da hbil, puede llamar/localizar a su doctor(a) al nmero que aparece a continuacin.   Por favor, tenga en cuenta que aunque hacemos todo lo posible para estar disponibles para asuntos urgentes fuera del horario de [place], no estamos disponibles las 24 horas del da, los 7 [address]  [address] de la [place].   Si tiene un problema urgente y no puede comunicarse con nosotros, puede optar por buscar atencin mdica  en el consultorio de su doctor(a), en una clnica privada, en un centro de atencin urgente o en una sala de emergencias.  Si tiene Engineer, drilling, por favor llame inmediatamente al 911 o vaya a la sala de emergencias.  Nmeros de bper  - Dr. Hester: [phone]  - Dra. Jackquline: [phone]  - Dr. Claudene: [phone]  -  Dra. Raymund: [phone]  En caso de inclemencias del tiempo, por favor llame a nuestra lnea principal al [phone] para una actualizacin sobre el [place] de cualquier retraso o cierre.  Consejos para la medicacin en dermatologa: Por favor, guarde las cajas en las que vienen los medicamentos de uso tpico para ayudarle a seguir las instrucciones sobre dnde y cmo usarlos. Las farmacias generalmente imprimen las instrucciones del medicamento slo en las cajas y no directamente en los tubos del [place].   Si su medicamento es muy caro, por favor, pngase en contacto con landry rieger llamando al [phone] y presione la opcin 4 o envenos un mensaje a travs de Clinical cytogeneticist.   No podemos decirle cul ser su copago por los medicamentos por adelantado ya que esto es diferente dependiendo de la cobertura de su seguro. Sin embargo, es posible que podamos encontrar un medicamento sustituto a Audiological scientist un formulario para que el seguro cubra el medicamento que se considera necesario.   Si se requiere una  autorizacin previa para que su compaa de seguros malta su medicamento, por favor permtanos de 1 a 2 das hbiles para completar este proceso.  Los precios de los medicamentos varan con frecuencia dependiendo del Environmental consultant de dnde se surte la receta y alguna farmacias pueden ofrecer precios ms baratos.  El sitio web www.goodrx.com tiene cupones para medicamentos de Health and safety inspector. Los precios aqu no tienen en cuenta lo que podra costar con la ayuda del seguro (puede ser ms barato con su seguro), pero el sitio web puede darle el precio si no utiliz Tourist information centre manager.  - Puede imprimir el cupn correspondiente y llevarlo con su receta a la farmacia.  - Tambin puede pasar por nuestra oficina durante el horario de atencin regular y Education officer, museum una tarjeta de cupones de GoodRx.  - Si necesita que su receta se enve electrnicamente a una farmacia diferente, informe a nuestra oficina a travs de MyChart de [place] o por telfono llamando al [phone] y presione la opcin 4.

## 2024-08-23 ENCOUNTER — Encounter: Payer: Self-pay | Admitting: Dermatology

## 2024-08-23 ENCOUNTER — Ambulatory Visit: Payer: Self-pay | Admitting: Dermatology

## 2024-08-23 LAB — SURGICAL PATHOLOGY

## 2024-08-23 NOTE — Telephone Encounter (Addendum)
 Called and discussed bx results with patient. She verbalized understanding. Will recheck dysplastic nevus at next follow up.  ----- Message from Alm Rhyme sent at 08/23/2024  4:53 PM EDT ----- FINAL DIAGNOSIS        1. Skin, right inframammary :       MELANOCYTIC NEVUS, COMPOUND TYPE        2. Skin, left medial thigh near groin anterior :       DYSPLASTIC JUNCTIONAL NEVUS WITH MODERATE ATYPIA, CLOSE TO MARGIN        3. Skin, left medial thigh near groin posterior :       ACRO[place]ORDON        4. Skin, right post lateral neck :       DYSPLASTIC JUNCTIONAL NEVUS WITH MODERATE ATYPIA, PERIPHERAL AND DEEP MARGINS       INVOLVED   1 - Benign mole No further treatment needed 2&4 - Both Moderate Dysplastic Recheck next visit 3- Benign skin tag No further treatment needed       ----- Message ----- From: Interface, Lab In Three Zero One Sent: 08/23/2024   4:13 PM EDT To: Alm JAYSON Rhyme, MD

## 2024-10-23 ENCOUNTER — Other Ambulatory Visit: Payer: Self-pay | Admitting: Internal Medicine

## 2024-10-23 DIAGNOSIS — Z1231 Encounter for screening mammogram for malignant neoplasm of breast: Secondary | ICD-10-CM

## 2024-11-22 ENCOUNTER — Ambulatory Visit
Admission: RE | Admit: 2024-11-22 | Discharge: 2024-11-22 | Disposition: A | Discharge status: Home / Self Care | Source: Ambulatory Visit | Attending: Internal Medicine | Admitting: Internal Medicine

## 2024-11-22 DIAGNOSIS — Z1231 Encounter for screening mammogram for malignant neoplasm of breast: Secondary | ICD-10-CM | POA: Insufficient documentation

## 2024-12-12 ENCOUNTER — Ambulatory Visit

## 2024-12-12 DIAGNOSIS — C44529 Squamous cell carcinoma of skin of other part of trunk: Secondary | ICD-10-CM | POA: Diagnosis not present

## 2024-12-12 DIAGNOSIS — D485 Neoplasm of uncertain behavior of skin: Secondary | ICD-10-CM

## 2024-12-12 DIAGNOSIS — L309 Dermatitis, unspecified: Secondary | ICD-10-CM | POA: Diagnosis not present

## 2024-12-12 MED ORDER — CLOBETASOL PROPIONATE 0.05 % EX OINT: TOPICAL_OINTMENT | CUTANEOUS | 0 refills | Status: AC

## 2024-12-12 NOTE — Patient Instructions (Addendum)
 Steroid Use  We prescribed you a topical steroid at today's visit.   General application instructions: -Apply this to any affected skin areas, twice (2 times) daily, for two (2) weeks -If the areas are better, you can stop -Re-start the topical steroid if the areas come back, or flare -If the areas don't get better after two weeks, we sometimes recommend taking a break for one (1) week, before restarting for another two (2) weeks. Repeat as needed  The most common side effects of topical steroid medications include changes in skin pigment and thinning of the skin. If the steroid is only applied to affected areas of the skin, these effects rarely occur unless the steroid is used for a very long time (years without stopping).   If we prescribed you a strong steroid, please avoid applying to face, groin, or neck, unless we tell you otherwise. We will include more detail in your prescription instructions.   Hand Hygiene Recommendations - Avoid overwashing your hands with harsh soap and water. Try to use Dove, Oil of Olay, or Cerave soap if you can, and after each handwashing, try to moisturize with something greasy. Recommendations include: --  Neutrogena Norwegian Formula Hand cream -- Eucerin Hand cream --  Vaseline or Aquaphor -- Aveeno Skin Healing Ointment -- Janeen Working Hands -- La Roche Posay Hand Cream  - At night, after applying the moisturizer, put on cotton gloves (available at [employer], 2 for $5). - Avoid using gel hand sanitizer if possible - Avoid hand contact with raw fruits as much as possible - Use cotton gloves under rubber gloves for all wetwork - Avoid excessive manipulation of cuticles - do not overcut.    Wound Care Instructions  Cleanse wound gently with soap and water once a day then pat dry with clean gauze. Apply a thin coat of Petrolatum (petroleum jelly, Vaseline) over the wound (unless you have an allergy to this). We recommend that you use a  new, sterile tube of Vaseline. Do not pick or remove scabs. Do not remove the yellow or white healing tissue from the base of the wound.  Cover the wound with fresh, clean, nonstick gauze and secure with paper tape. You may use Band-Aids in place of gauze and tape if the wound is small enough, but would recommend trimming much of the tape off as there is often too much. Sometimes Band-Aids can irritate the skin.  You should call the office for your biopsy report after 1 week if you have not already been contacted.  If you experience any problems, such as abnormal amounts of bleeding, swelling, significant bruising, significant pain, or evidence of infection, please call the office immediately.  FOR ADULT SURGERY PATIENTS: If you need something for pain relief you may take 1 extra strength Tylenol  (acetaminophen ) AND 2 Ibuprofen (200mg  each) together every 4 hours as needed for pain. (do not take these if you are allergic to them or if you have a reason you should not take them.) Typically, you may only need pain medication for 1 to 3 days.   Due to recent changes in healthcare laws, you may see results of your pathology and/or laboratory studies on MyChart before the doctors have had a chance to review them. We understand that in some cases there may be results that are confusing or concerning to you. Please understand that not all results are received at the same time and often the doctors may need to interpret multiple results in order to  provide you with the best plan of care or course of treatment. Therefore, we ask that you please give us  2 business days to thoroughly review all your results before contacting the office for clarification. Should we see a critical lab result, you will be contacted sooner.   If You Need Anything After Your Visit  If you have any questions or concerns for your doctor, please call our main line at [phone] and press option 4 to reach your doctor's medical  assistant. If no one answers, please leave a voicemail as directed and we will return your call as soon as possible. Messages left after 4 pm will be answered the following business day.   You may also send us  a message via MyChart. We typically respond to MyChart messages within 1-2 business days.  For prescription refills, please ask your pharmacy to contact our office. Our fax number is [phone].  If you have an urgent issue when the clinic is closed that cannot wait until the next business day, you can page your doctor at the number below.    Please note that while we do our best to be available for urgent issues outside of office hours, we are not available 24/7.   If you have an urgent issue and are unable to reach us , you may choose to seek medical care at your doctor's office, retail clinic, urgent care center, or emergency room.  If you have a medical emergency, please immediately call 911 or go to the emergency department.  Pager Numbers  - Dr. Hester: [phone]  - Dr. Jackquline: [phone]  - Dr. Claudene: [phone]   - Dr. Raymund: [phone]  In the event of inclement weather, please call our main line at [phone] for an update on the status of any delays or closures.  Dermatology Medication Tips: Please keep the boxes that topical medications come in in order to help keep track of the instructions about where and how to use these. Pharmacies typically print the medication instructions only on the boxes and not directly on the medication tubes.   If your medication is too expensive, please contact our office at [phone] option 4 or send us  a message through MyChart.   We are unable to tell what your co-pay for medications will be in advance as this is different depending on your insurance coverage. However, we may be able to find a substitute medication at lower cost or fill out paperwork to get insurance to cover a needed medication.   If a prior  authorization is required to get your medication covered by your insurance company, please allow us  1-2 business days to complete this process.  Drug prices often vary depending on where the prescription is filled and some pharmacies may offer cheaper prices.  The website www.goodrx.com contains coupons for medications through different pharmacies. The prices here do not account for what the cost may be with help from insurance (it may be cheaper with your insurance), but the website can give you the price if you did not use any insurance.  - You can print the associated coupon and take it with your prescription to the pharmacy.  - You may also stop by our office during regular business hours and pick up a GoodRx coupon card.  - If you need your prescription sent electronically to a different pharmacy, notify our office through [facility] or by phone at [phone] option 4.     Si Usted Necesita Algo Despus de Su Visita  Tambin puede enviarnos un mensaje a travs de MyChart. Por lo general respondemos a los mensajes de MyChart en el transcurso de 1 a 2 das hbiles.  Para renovar recetas, por favor pida a su farmacia que se ponga en contacto con nuestra oficina. Randi lakes de fax es [place] [phone].  Si tiene un asunto urgente cuando la clnica est cerrada y que no puede esperar hasta el siguiente da hbil, puede llamar/localizar a su doctor(a) al nmero que aparece a continuacin.   Por favor, tenga en cuenta que aunque hacemos todo lo posible para estar disponibles para asuntos urgentes fuera del horario de [place], no estamos disponibles las 24 horas del da, los 7 [address]  [address] de la [place].   Si tiene un problema urgente y no puede comunicarse con nosotros, puede optar por buscar atencin mdica  en el consultorio de su doctor(a), en una clnica privada, en un centro de atencin urgente o en una sala de emergencias.  Si tiene engineer, drilling, por favor llame  inmediatamente al 911 o vaya a la sala de emergencias.  Nmeros de bper  - Dr. Hester: [phone]  - Dra. Jackquline: [phone]  - Dr. Claudene: [phone]  - Dra. Kitts: [phone]  En caso de inclemencias del [place], por favor llame a nuestra lnea principal al [phone] para una actualizacin sobre el estado de cualquier retraso o cierre.  Consejos para la medicacin en dermatologa: Por favor, guarde las cajas en las que vienen los medicamentos de uso tpico para ayudarle a seguir las instrucciones sobre dnde y cmo usarlos. Las farmacias generalmente imprimen las instrucciones del medicamento slo en las cajas y no directamente en los tubos del [place].   Si su medicamento es muy caro, por favor, pngase en contacto con landry rieger llamando al [phone] y presione la opcin 4 o envenos un mensaje a travs de Clinical Cytogeneticist.   No podemos decirle cul ser su copago por los medicamentos por adelantado ya que esto es diferente dependiendo de la cobertura de su seguro. Sin embargo, es posible que podamos encontrar un medicamento sustituto a audiological scientist un formulario para que el seguro cubra el medicamento que se considera necesario.   Si se requiere una autorizacin previa para que su compaa de seguros cubra su medicamento, por favor permtanos de 1 a 2 das hbiles para completar este proceso.  Los precios de los medicamentos varan con frecuencia dependiendo del environmental consultant de dnde se surte la receta y alguna farmacias pueden ofrecer precios ms baratos.  El sitio web www.goodrx.com tiene cupones para medicamentos de health and safety inspector. Los precios aqu no tienen en cuenta lo que podra costar con la ayuda del seguro (puede ser ms barato con su seguro), pero el sitio web puede darle el precio si no utiliz tourist information centre manager.  - Puede imprimir el cupn correspondiente y llevarlo con su receta a la farmacia.  - Tambin puede pasar por nuestra oficina durante el horario  de atencin regular y education officer, museum una tarjeta de cupones de GoodRx.  - Si necesita que su receta se enve electrnicamente a una farmacia diferente, informe a nuestra oficina a travs de MyChart de  o por telfono llamando al [phone] y presione la opcin 4.

## 2024-12-12 NOTE — Progress Notes (Signed)
" °  °  Subjective   Claire Olsen is a 44 y.o. female who presents for the following: Lesion(s) of concern . Patient is established patient   Today patient reports: Lesion on chest x 1 mth, painful at times, pt concerned and would like checked today. Pt c/o peeling and cracking of the finger tips.  Review of Systems:    No other skin or systemic complaints except as noted in HPI or Assessment and Plan.  The following portions of the chart were reviewed this encounter and updated as appropriate: medications, allergies, medical history  Relevant Medical History:  Dysplastic nevi   Objective  (SKPE) Well appearing patient in no apparent distress; mood and affect are within normal limits. Examination was performed of the: Focused Exam of: the chest   Examination notable for: 0.7 cm domed pink papule - Hands with xerosis and lichenified plaques, fissuring of webspaces and scale  Examination limited by: n/a   chest 0.7 cm domed pink papule.   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 Condition is symptomatic and bothersome to patient. Patient is flaring and not currently at treatment goal.  - Diagnosis, treatment options, prognosis, risk/ benefit, and side effects of treatment were discussed with the patient.  - Start clobetasol  ointment 0.05% twice daily to affected skin Discussed side effect of super potent topical steroids including atrophy, dyspigmentation, striae, telangectasia, folliculitis, loss of skin pigment, hair growth, tachyphylaxis, risk of systemic absorption with missuse. - Moisturization with with thick ointment like vaseline, use cotton gloves to sleep in at night - Wash hands in lukewarm water - Use cotton gloves under rubber gloves when using cleaning products - Recommend vaseline for hands and clobetasol  only   Was sun protection counseling provided?: No   Level of  service outlined above   Procedures, orders, diagnosis for this visit:  NEOPLASM OF UNCERTAIN BEHAVIOR OF SKIN chest - Epidermal / dermal shaving  Lesion diameter (cm):  0.7 Informed consent: discussed and consent obtained   Timeout: patient name, date of birth, surgical site, and procedure verified   Procedure prep:  Patient was prepped and draped in usual sterile fashion Prep type:  Isopropyl alcohol Anesthesia: the lesion was anesthetized in a standard fashion   Anesthetic:  1% lidocaine  w/ epinephrine  1-100,000 buffered w/ 8.4% NaHCO3 Instrument used: DermaBlade   Hemostasis achieved with: pressure and aluminum chloride   Outcome: patient tolerated procedure well   Post-procedure details: wound care instructions given    Specimen 1 - Surgical pathology Differential Diagnosis: D48.5 r/o SCC vs other Check Margins: No  Neoplasm of uncertain behavior of skin -     Epidermal / dermal shaving -     Surgical pathology; Standing  Other orders -     Clobetasol  Propionate; Apply 1 gram topically to affected area of skin twice daily. Stop once resolved and restart as needed for flares. Avoid use on face, armpits, groin unless otherwise indicated.  Dispense: 60 g; Refill: 0      Return to clinic: Return for appointment as scheduled.  LILLETTE Rosina Mayans, CMA, am acting as scribe for Lauraine JAYSON Kanaris, MD .  Documentation: I have reviewed the above documentation for accuracy and completeness, and I agree with the above.  Lauraine JAYSON Kanaris, MD  "

## 2024-12-13 LAB — SURGICAL PATHOLOGY

## 2024-12-14 ENCOUNTER — Ambulatory Visit: Payer: Self-pay

## 2024-12-14 ENCOUNTER — Ambulatory Visit

## 2024-12-14 DIAGNOSIS — C44529 Squamous cell carcinoma of skin of other part of trunk: Secondary | ICD-10-CM | POA: Diagnosis not present

## 2024-12-14 DIAGNOSIS — C4492 Squamous cell carcinoma of skin, unspecified: Secondary | ICD-10-CM

## 2024-12-14 MED ORDER — FLUOROURACIL CHEMO INJECTION 500 MG/10ML FOR DERMATOLOGY USE
5.0000 mg | Freq: Once | INTRAVENOUS | Status: AC
  Administered 2024-12-14: 5 mg via INTRALESIONAL

## 2024-12-14 NOTE — Patient Instructions (Signed)

## 2024-12-14 NOTE — Progress Notes (Signed)
 Patient is informed and appointment scheduled for this afternoon.

## 2024-12-14 NOTE — Progress Notes (Signed)
" °  °  Subjective   Claire Olsen is a 44 y.o. female who presents for the following: ED&C and IL5FU of SCC. Patient is established patient   Today patient reports: No issues since biopsy   Review of Systems:    No other skin or systemic complaints except as noted in HPI or Assessment and Plan.  The following portions of the chart were reviewed this encounter and updated as appropriate: medications, allergies, medical history  Relevant Medical History:  Personal history of non melanoma skin cancer - see medical history for full details   Objective  (SKPE) Well appearing patient in no apparent distress; mood and affect are within normal limits. Examination was performed of the: Focused Exam of: Chest    Examination notable for: Well healed biopsy site     Chest Well healed biopsy site   Assessment & Plan  (SKAP)   Procedures, orders, diagnosis for this visit:  SQUAMOUS CELL CARCINOMA OF SKIN Chest - Destruction of lesion Complexity: simple   Destruction method: electrodesiccation and curettage   Informed consent: discussed and consent obtained   Timeout:  patient name, date of birth, surgical site, and procedure verified Procedure prep:  Patient was prepped and draped in usual sterile fashion Prep type:  Isopropyl alcohol Anesthesia: the lesion was anesthetized in a standard fashion   Anesthetic:  1% lidocaine  w/ epinephrine  1-100,000 local infiltration Curettage performed in three different directions: Yes   Electrodesiccation performed over the curetted area: Yes   Curettage cycles:  3 Final wound size (cm):  1.3 Hemostasis achieved with:  aluminum chloride and electrodesiccation Outcome: patient tolerated procedure well with no complications   Post-procedure details: sterile dressing applied and wound care instructions given   Dressing type: bandage and petrolatum    - Intralesional injection Location: Chest   Informed Consent: Discussed risks (infection, pain,  bleeding, bruising, thinning of the skin, loss of skin pigment, lack of resolution, and recurrence of lesion) and benefits of the procedure, as well as the alternatives. Informed consent was obtained. Preparation: The area was prepared a standard fashion.  Procedure Details: An intralesional injection was performed with 5-fluorouracil . 0.5 cc in total were injected.  Total number of injections: 1  Plan: The patient was instructed on post-op care. Recommend OTC analgesia as needed for pain.   This Visit - fluorouracil  (ADRUCIL ) chemo injection 5 mg  Squamous cell carcinoma of skin -     Destruction of lesion -     Intralesional injection -     fluorouracil     Return to clinic: Return in about 6 months (around 06/13/2025) for TBSE.  I, Emerick Ege, CMA am acting as scribe for Lauraine JAYSON Kanaris, MD.   Documentation: I have reviewed the above documentation for accuracy and completeness, and I agree with the above.  Lauraine JAYSON Kanaris, MD  "

## 2025-06-14 ENCOUNTER — Ambulatory Visit

## 2025-08-27 ENCOUNTER — Ambulatory Visit: Admitting: Dermatology
